# Patient Record
Sex: Male | Born: 1970 | Race: White | Hispanic: No | Marital: Married | Smoking: Never smoker
Health system: Southern US, Community
[De-identification: ages and names within clinical notes are randomized; demographics above are authoritative.]

## PROBLEM LIST (undated history)

## (undated) DIAGNOSIS — Z87898 Personal history of other specified conditions: Secondary | ICD-10-CM

## (undated) DIAGNOSIS — I8390 Asymptomatic varicose veins of unspecified lower extremity: Secondary | ICD-10-CM

## (undated) DIAGNOSIS — K625 Hemorrhage of anus and rectum: Secondary | ICD-10-CM

## (undated) DIAGNOSIS — I4891 Unspecified atrial fibrillation: Secondary | ICD-10-CM

## (undated) HISTORY — DX: Unspecified atrial fibrillation: I48.91

## (undated) HISTORY — PX: ARTHROSCOPIC REPAIR ACL: SUR80

## (undated) HISTORY — DX: Personal history of other specified conditions: Z87.898

## (undated) HISTORY — DX: Asymptomatic varicose veins of unspecified lower extremity: I83.90

## (undated) HISTORY — DX: Hemorrhage of anus and rectum: K62.5

## (undated) HISTORY — PX: KNEE ARTHROSCOPY: SUR90

---

## 1990-11-09 HISTORY — PX: OTHER SURGICAL HISTORY: SHX169

## 2005-02-05 ENCOUNTER — Ambulatory Visit: Payer: Self-pay | Admitting: Internal Medicine

## 2006-09-15 ENCOUNTER — Ambulatory Visit: Payer: Self-pay | Admitting: Internal Medicine

## 2008-04-17 ENCOUNTER — Ambulatory Visit: Payer: Self-pay | Admitting: Internal Medicine

## 2008-04-17 DIAGNOSIS — I8390 Asymptomatic varicose veins of unspecified lower extremity: Secondary | ICD-10-CM

## 2008-04-26 ENCOUNTER — Ambulatory Visit: Payer: Self-pay | Admitting: Internal Medicine

## 2008-04-26 LAB — CONVERTED CEMR LAB
Bilirubin Urine: NEGATIVE
Glucose, Urine, Semiquant: NEGATIVE
WBC Urine, dipstick: NEGATIVE
pH: 7.5

## 2008-05-21 LAB — CONVERTED CEMR LAB
AST: 18 units/L (ref 0–37)
Alkaline Phosphatase: 59 units/L (ref 39–117)
Basophils Absolute: 0 10*3/uL (ref 0.0–0.1)
Chloride: 104 meq/L (ref 96–112)
Cholesterol: 171 mg/dL (ref 0–200)
Creatinine, Ser: 1 mg/dL (ref 0.4–1.5)
Eosinophils Absolute: 0.1 10*3/uL (ref 0.0–0.7)
GFR calc non Af Amer: 89 mL/min
HDL: 28.8 mg/dL — ABNORMAL LOW (ref >39.0)
MCHC: 33 g/dL (ref 30.0–36.0)
MCV: 84.2 fL (ref 78.0–100.0)
Neutrophils Relative %: 53.2 % (ref 43.0–77.0)
Platelets: 245 10*3/uL (ref 150–400)
Potassium: 4 meq/L (ref 3.5–5.1)
TSH: 2.01 microintl units/mL (ref 0.35–5.50)
Total Bilirubin: 0.9 mg/dL (ref 0.3–1.2)
VLDL: 21 mg/dL (ref 0–40)

## 2008-05-30 ENCOUNTER — Telehealth: Payer: Self-pay | Admitting: Internal Medicine

## 2009-01-22 ENCOUNTER — Emergency Department (HOSPITAL_COMMUNITY): Admission: EM | Admit: 2009-01-22 | Discharge: 2009-01-22 | Payer: Self-pay | Admitting: Internal Medicine

## 2009-01-24 ENCOUNTER — Encounter: Payer: Self-pay | Admitting: Internal Medicine

## 2009-05-03 ENCOUNTER — Telehealth: Payer: Self-pay | Admitting: Internal Medicine

## 2009-06-13 ENCOUNTER — Ambulatory Visit: Payer: Self-pay | Admitting: Internal Medicine

## 2009-06-13 DIAGNOSIS — I4891 Unspecified atrial fibrillation: Secondary | ICD-10-CM

## 2009-06-22 DIAGNOSIS — R55 Syncope and collapse: Secondary | ICD-10-CM | POA: Insufficient documentation

## 2009-06-22 DIAGNOSIS — Z9889 Other specified postprocedural states: Secondary | ICD-10-CM

## 2010-08-27 IMAGING — CT CT PELVIS W/ CM
3 of 5 series · 13 of 32 positions shown, 18 images · IV contrast (water- bmi proto & 100 ML OMNI 300)
Comparison: None

CLINICAL DATA: Left lower quadrant pain radiating to left testicle
for 1 week

CT ABDOMEN WITH CONTRAST,CT PELVIS WITH CONTRAST
TECHNIQUE: Multidetector CT imaging of the abdomen was performed
following the standard protocol during bolus administration of
intravenous contrast.,Technique:  Multidetector CT imaging of the
pelvis was performed following the standard protocol during
Contrast: 100 ml of [id number]-A00

[Series 2: routine abdomen · axial · 0.86mm/px · z∈[-580,-215]mm · 6 of 103 slices shown, 11 images]
[im 15/103  soft-tissue]
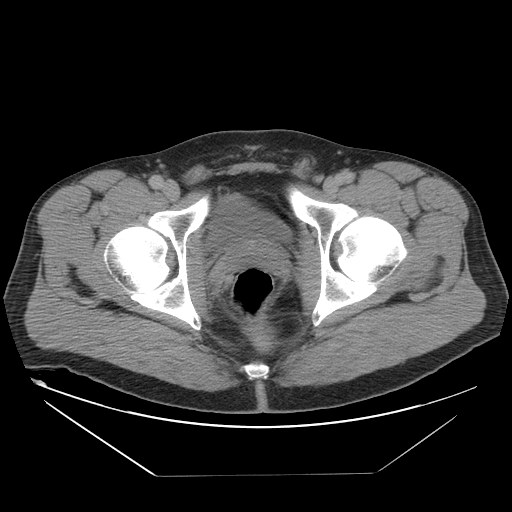
[im 15/103  bone]
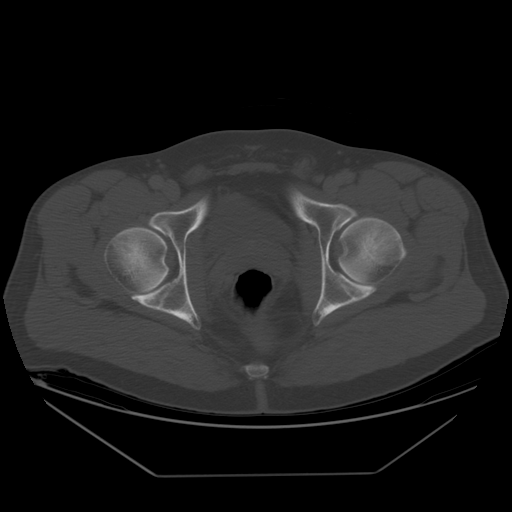
[im 30/103  soft-tissue]
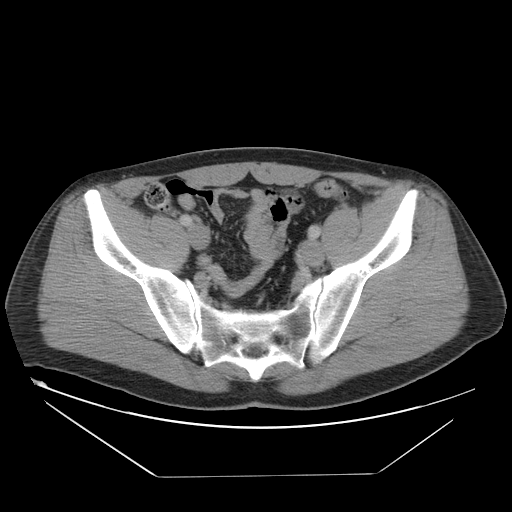
[im 44/103  soft-tissue]
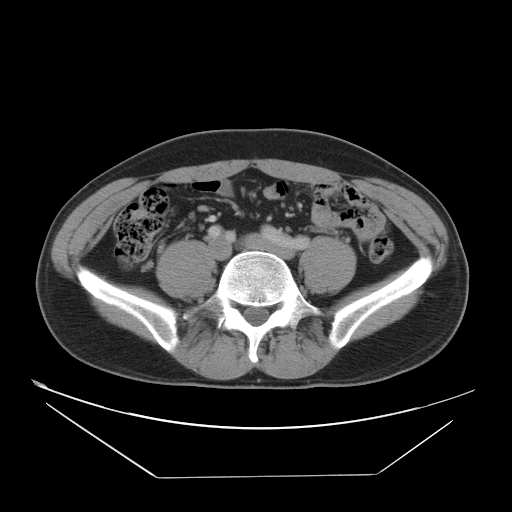
[im 44/103  lung]
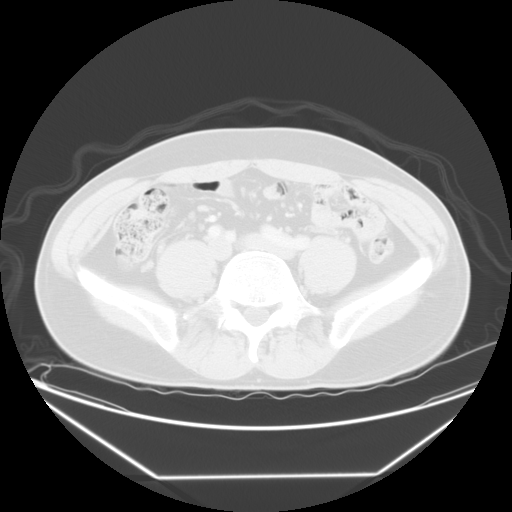
[im 59/103  soft-tissue]
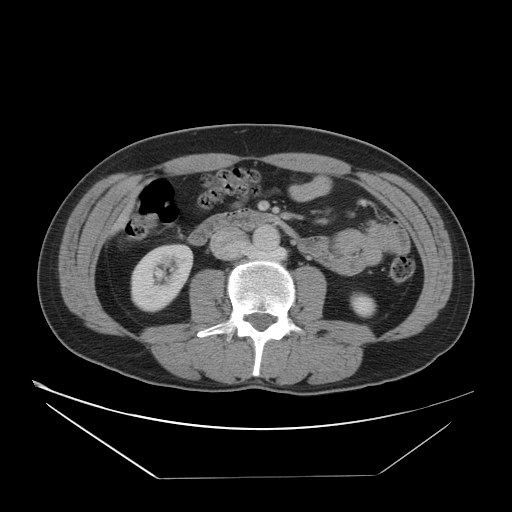
[im 59/103  lung]
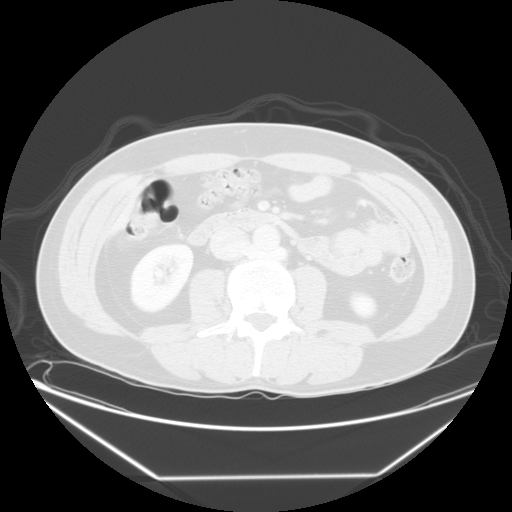
[im 73/103  soft-tissue]
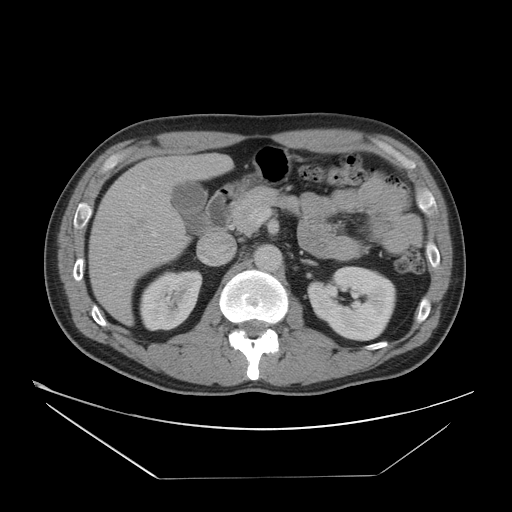
[im 73/103  lung]
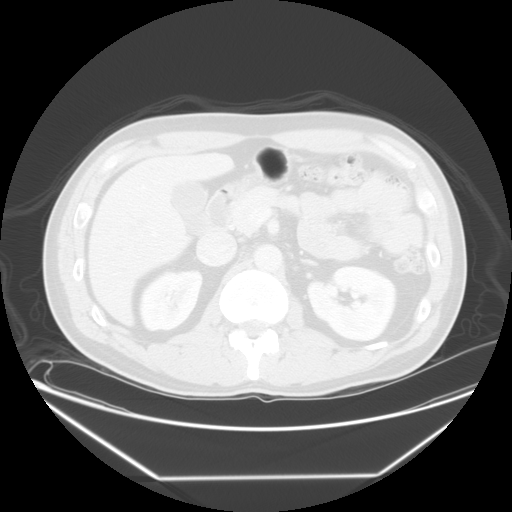
[im 88/103  soft-tissue]
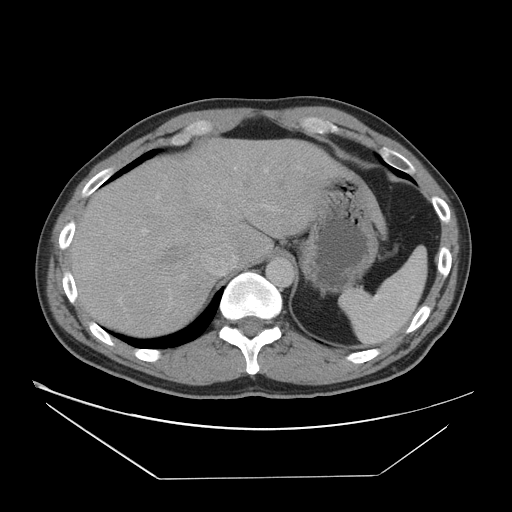
[im 88/103  lung]
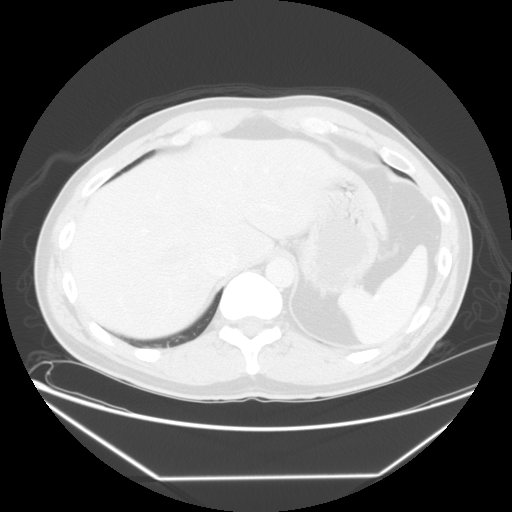

[Series 3: recon 2: routine abdomen · axial · 0.86mm/px · z∈[-595,-225]mm · 2 of 51 slices shown]
[im 17/51  soft-tissue]
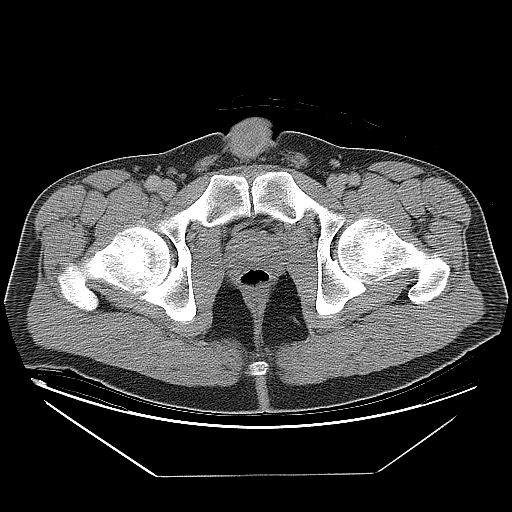
[im 34/51  soft-tissue]
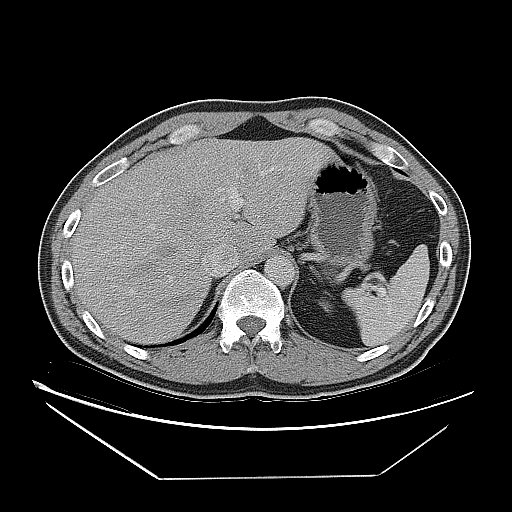

[Series 400: sag a/p · sagittal · 1.05mm/px · 5 of 124 slices shown]
[im 14/124  soft-tissue]
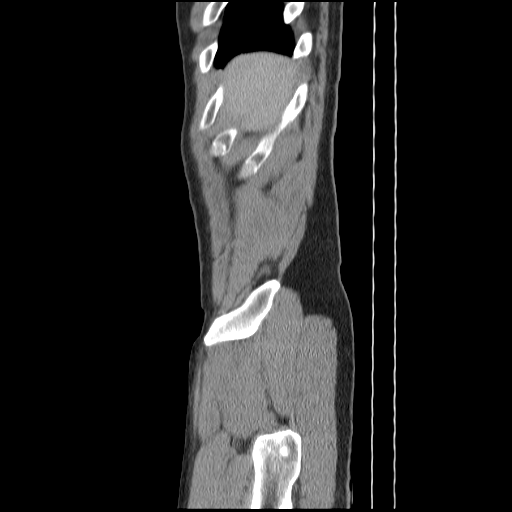
[im 28/124  soft-tissue]
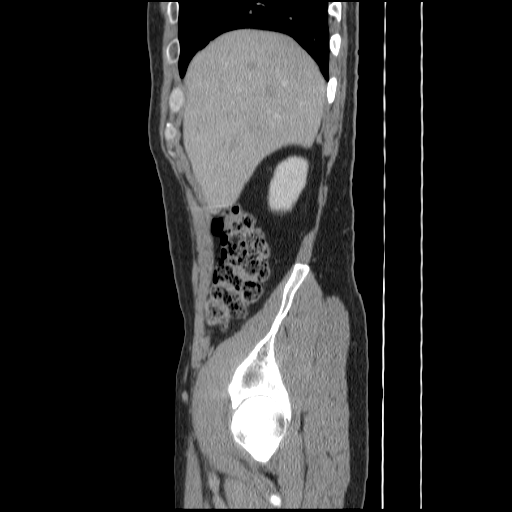
[im 42/124  soft-tissue]
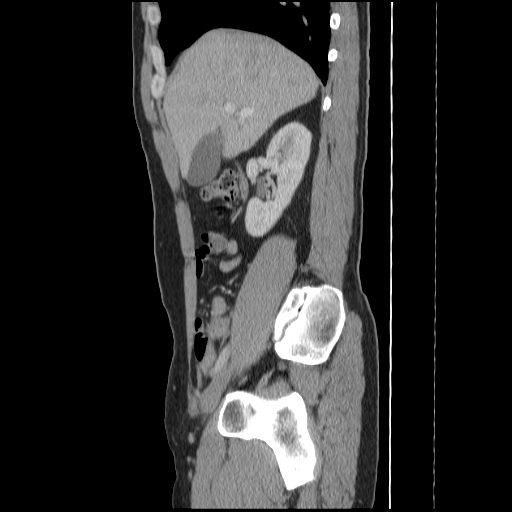
[im 55/124  soft-tissue]
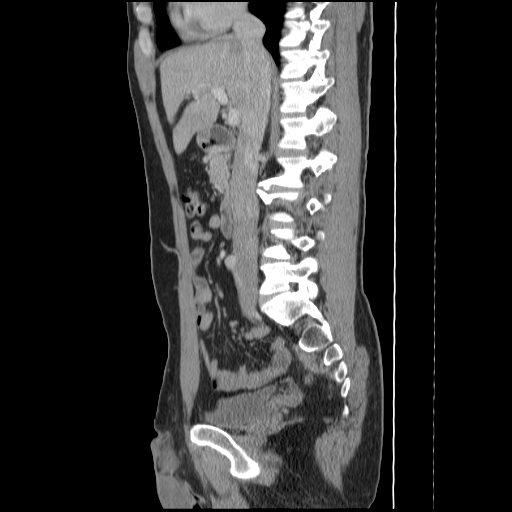
[im 69/124  soft-tissue]
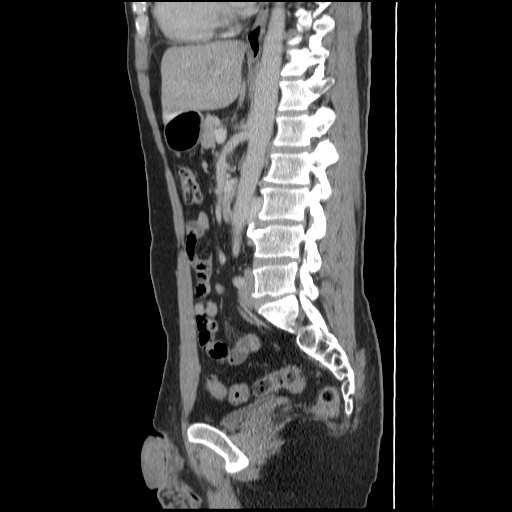

[13 of 32 positions shown; findings below may reference images not displayed]

FINDINGS: Visualized lung bases are unremarkable.  Multilevel
degenerative changes of the lumbar spine noted.  Schmorl's node
deformity noted lower endplate of T12, L1-L2 and L12-3 level.
Schmorl's node deformity noted upper endplate of L5.  Large
anterior osteophytes noted at the upper endplate of L4.  Multilevel
flattening of the space noted most significant at L2-L3 and L5-S1
level.  No acute fracture is noted.

Enhanced liver, spleen, pancreas and the adrenals are unremarkable.
A retroaortic left renal vein is noted.

Enhanced kidney are symmetrical in size.  No focal renal mass is
noted.  Mild focal cortical thinning noted mid pole posterior
cortex of the left kidney probable due to scarring.  No
hydronephrosis or hydroureter.  Delayed images of the kidneys shows
bilateral renal symmetrical excretion.  No aortic aneurysm.  No
bowel obstruction.  No ascites or free air.  No adenopathy.
IMPRESSION: 1.  Multilevel degenerative changes lumbar spine.  No acute
fracture.
2.  No acute inflammatory process within abdomen.
3.  No aortic aneurysm.  There is a retroaortic left renal vein.

Findings for the pelvis:

No pelvic fractures are noted.  A few diverticula are seen in the
sigmoid colon without evidence of acute diverticulitis.  The
urinary bladder is under distended grossly unremarkable.  No pelvic
ascites or adenopathy.  No inguinal adenopathy.  No pericecal
inflammation.  Normal appendix is clearly visualized.  The prostate
gland is unremarkable. Mild degenerative changes left SI joint.
IMPRESSION: 1.  No pelvic fractures.

2.  Mild degenerative changes left SI joint.
3.  No diverticulitis.  Normal appendix.

## 2010-12-03 ENCOUNTER — Encounter: Payer: Self-pay | Admitting: Internal Medicine

## 2010-12-17 NOTE — Consult Note (Signed)
Summary: [facility]  [facility]   Imported By: Maryln Gottron 12/10/2010 13:40:08  _____________________________________________________________________  External Attachment:    Type:   Image     Comment:   External Document

## 2011-02-19 LAB — URINALYSIS, ROUTINE W REFLEX MICROSCOPIC
Bilirubin Urine: NEGATIVE
Glucose, UA: NEGATIVE mg/dL
Hgb urine dipstick: NEGATIVE
Ketones, ur: NEGATIVE mg/dL
Nitrite: NEGATIVE
pH: 6 (ref 5.0–8.0)

## 2011-02-19 LAB — DIFFERENTIAL
Basophils Absolute: 0.1 10*3/uL (ref 0.0–0.1)
Eosinophils Relative: 1 % (ref 0–5)
Lymphocytes Relative: 15 % (ref 12–46)
Monocytes Absolute: 0.6 10*3/uL (ref 0.1–1.0)
Monocytes Relative: 6 % (ref 3–12)

## 2011-02-19 LAB — CBC
HCT: 45.1 % (ref 39.0–52.0)
Hemoglobin: 15.4 g/dL (ref 13.0–17.0)
RDW: 14.2 % (ref 11.5–15.5)

## 2011-03-27 NOTE — Assessment & Plan Note (Signed)
[facility]                           ELECTROPHYSIOLOGY OFFICE NOTE   NAME:LEWISBrook, Mall                     MRN:          [id number]  DATE:09/15/2006                            DOB:          [birth year]    Mr. Verstraete comes in today because of a history of paroxysmal atrial  fibrillation.  He was denied insurance from his insurance company because of  some part of our health records.   Reviewing the records demonstrates that he has paroxysmal atrial  fibrillation.  He also had an ejection fraction that appeared to have been  underrepresented at 46%; an echocardiogram done two weeks thereafter  demonstrated normal left ventricular function.   It also describes some nonexertional chest discomfort that resembles  somewhat of his reflux.   Electrocardiogram today demonstrates sinus rhythm at 51, with intervals of  0.15/0.10/0.40.   IMPRESSION:  1. Paroxysmal atrial fibrillation.  2. __________ chest pain.  3. Discordant number of left ventricular ejection fraction assessment.   Mr. Kempner will get [place] some information from his insurance company as to what  the issues are and see if we cannot help him with his application.    ______________________________  Duke Salvia, MD, [facility]    SCK/MedQ  DD: 09/15/2006  DT: 09/16/2006  Job #: [id number]

## 2011-05-07 ENCOUNTER — Ambulatory Visit: Payer: Self-pay | Admitting: Internal Medicine

## 2011-06-11 ENCOUNTER — Telehealth: Payer: Self-pay | Admitting: Internal Medicine

## 2011-06-11 MED ORDER — FLECAINIDE ACETATE 100 MG PO TABS: 100.0000 mg | ORAL_TABLET | ORAL | Status: DC | PRN

## 2011-06-11 NOTE — Telephone Encounter (Signed)
Per pt call, pt needs flecainide called into [facility] on [address]

## 2011-07-01 ENCOUNTER — Other Ambulatory Visit (INDEPENDENT_AMBULATORY_CARE_PROVIDER_SITE_OTHER): Payer: BC Managed Care – PPO

## 2011-07-01 DIAGNOSIS — Z0000 Encounter for general adult medical examination without abnormal findings: Secondary | ICD-10-CM

## 2011-07-01 LAB — HEPATIC FUNCTION PANEL
Bilirubin, Direct: 0.1 mg/dL (ref 0.0–0.3)
Total Bilirubin: 0.9 mg/dL (ref 0.3–1.2)
Total Protein: 7.4 g/dL (ref 6.0–8.3)

## 2011-07-01 LAB — CBC WITH DIFFERENTIAL/PLATELET
Eosinophils Absolute: 0.1 10*3/uL (ref 0.0–0.7)
HCT: 46.8 % (ref 39.0–52.0)
Lymphs Abs: 2.2 10*3/uL (ref 0.7–4.0)
MCHC: 32.9 g/dL (ref 30.0–36.0)
MCV: 83.7 fl (ref 78.0–100.0)
Monocytes Absolute: 0.8 10*3/uL (ref 0.1–1.0)
Neutrophils Relative %: 53.8 % (ref 43.0–77.0)
Platelets: 219 10*3/uL (ref 150.0–400.0)
RDW: 14.8 % — ABNORMAL HIGH (ref 11.5–14.6)

## 2011-07-01 LAB — LIPID PANEL
Cholesterol: 201 mg/dL — ABNORMAL HIGH (ref 0–200)
HDL: 39 mg/dL — ABNORMAL LOW (ref >39.00)
Total CHOL/HDL Ratio: 5
Triglycerides: 118 mg/dL (ref 0.0–149.0)

## 2011-07-01 LAB — BASIC METABOLIC PANEL
BUN: 15 mg/dL (ref 6–23)
CO2: 30 mEq/L (ref 19–32)
Chloride: 104 mEq/L (ref 96–112)
Creatinine, Ser: 1 mg/dL (ref 0.4–1.5)
Glucose, Bld: 98 mg/dL (ref 70–99)

## 2011-07-01 LAB — POCT URINALYSIS DIPSTICK
Leukocytes, UA: NEGATIVE
Protein, UA: NEGATIVE
pH, UA: 5.5

## 2011-07-08 ENCOUNTER — Ambulatory Visit (INDEPENDENT_AMBULATORY_CARE_PROVIDER_SITE_OTHER): Payer: BC Managed Care – PPO | Admitting: Internal Medicine

## 2011-07-08 ENCOUNTER — Encounter: Payer: Self-pay | Admitting: Internal Medicine

## 2011-07-08 VITALS — BP 120/80 | HR 72 | Ht 79.0 in | Wt 260.0 lb

## 2011-07-08 DIAGNOSIS — Z0000 Encounter for general adult medical examination without abnormal findings: Secondary | ICD-10-CM

## 2011-07-08 DIAGNOSIS — M7918 Myalgia, other site: Secondary | ICD-10-CM

## 2011-07-08 DIAGNOSIS — IMO0001 Reserved for inherently not codable concepts without codable children: Secondary | ICD-10-CM

## 2011-07-08 DIAGNOSIS — I8390 Asymptomatic varicose veins of unspecified lower extremity: Secondary | ICD-10-CM

## 2011-07-08 DIAGNOSIS — I4891 Unspecified atrial fibrillation: Secondary | ICD-10-CM

## 2011-07-08 MED ORDER — FLECAINIDE ACETATE 100 MG PO TABS: 100.0000 mg | ORAL_TABLET | ORAL | Status: DC | PRN

## 2011-07-08 NOTE — Progress Notes (Signed)
Subjective:    Patient ID: Gregory Peck, male    DOB: 1971-02-14, 40 y.o.   MRN: [id number]  HPI Patient comes in today for Preventive Health Care visit . His last visit was 04/2008 in our primary care practice. Since his last visit he has had no major changes in his health status. He is on medication for paroxysmal atrial fibrillation that he takes as needed. His last visit with Dr. Graciela Husbands was almost 2 years ago.  asks about  Cardiac med. He takes medicine as needed one pill if he thinks it's going to flare up in 3 pills if he is in active atrial fib and it usually calms it down after 2-3 hours. He has about 3 pills left over like to have some more in case. He denies any recent episode chest pain shortness of breath or exercise intolerance. Called for refill    to cardiology and the nurse was supposed to get back with him about it he needed an appointment ; never heard back from cardiology.  Hearing and vision test this year is good.   Old injury foot left toe laceration was problematic and now has left hip  back pocket area  Dull ache and not focal.?  From gait issue  Thinks gait changed after old injury.     Has seen chiro and PT .   ocass left foot  Issue. Not getting better wonders if we have any suggestion [place] uncle  died of brain aneurism.     / what risk is he having with his varicose veins. He has no history of unusual headache syncope related to this vision changes..     Review of Systems ROS:  GEN/ HEENTNo fever, significant weight changes sweats headaches vision problems hearing changes, CV/ PULM; No chest pain shortness of breath cough, syncope,edema  change in exercise tolerance.  Except  Attacks of arrythmia  Taking  Tambocor   As needed  Has 3 pills left. GI /GU: No adominal pain, vomiting, change in bowel habits. No blood in the stool. No significant GU symptoms. SKIN/HEME: ,no acute skin rashes suspicious lesions or bleeding. No lymphadenopathy, nodules, masses.  NEURO/ PSYCH:   No neurologic signs such as weakness numbness No depression anxiety. IMM/ Allergy: No unusual infections.  Allergy .   REST of 12 system review negative. Or see history of present illness  Past history family history social history reviewed in the electronic medical record.      Objective:   Physical Exam Physical Exam: Vital signs reviewed WUJ:WJXB is a well-developed well-nourished alert cooperative  White male  who appears   stated age in no acute distress.  HEENT: normocephalic  traumatic , Eyes: PERRL EOM's full, conjunctiva clear, Nares: patent no deformity discharge or tenderness., Ears: no deformity EAC's clear TMs with normal landmarks. Mouth: clear OP, no lesions, edema.  Moist mucous membranes. Dentition in adequate repair. NECK: supple without masses, thyromegaly or bruits. CHEST/PULM:  Clear to auscultation and percussion breath sounds equal no wheeze , rales or rhonchi. No chest wall deformities or tenderness. CV: PMI is nondisplaced, S1 S2 no gallops, murmurs, rubs. Peripheral pulses are full without delay.No JVD .  ABDOMEN: Bowel sounds normal nontender  No guard or rebound, no hepato splenomegal no CVA tenderness.  No hernia. Has a history of hard bowel movements occasionally gets a hemorrhoid flare. Saw Dr. Clent Ridges in the past and had a colonoscopy at age 60 Extremtities:  No clubbing cyanosis or edema, no acute joint  swelling or redness no focal atrophy he has prominent varicose veins on his left lower extremity more than his right; there no ulcerations. NEURO:  Oriented x3, cranial nerves 3-12 appear to be intact, no obvious focal weakness,gait within normal limits no abnormal reflexes or asymmetrical SKIN: No acute rashes normal turgor, color, no bruising or petechiae. PSYCH: Oriented, good eye contact, no obvious depression anxiety, cognition and judgment appear normal. LN:  No cervical axillary or inguinal adenopathy Laboratory studies reviewed    Assessment & Plan:   Preventive Health Care  It has been over 3 years since last visit and updated as a new patient Counseled regarding healthy nutrition, exercise, sleep, injury prevention, calcium vit d and healthy weight . Discussed cutting out sweets tea excess sugars Up to date  on healthcare parameters per hx .  Left buttock hip back pain. Possibly secondary to gait change from old injury he has apparently been through chiropractic multiple physical therapies not really interested in seeing an orthopedic at this time. Discussed seeing sports medicine clinic gait training etc. will call us if he is interested. In the meantime strength and hamstrings and hip girdle and obliques.  Paroxyxmal Atrial fib  hx Using prn  meds  And due for cards check that fell through.  We'll go ahead and refill 15 pills and try to facilitate his office appointment with Dr. Graciela Husbands  varicose veins   no change or progression  Other ?s answered

## 2011-07-08 NOTE — Patient Instructions (Addendum)
Call if want a referral to sports medicine   Or other for the back buttock pain.  The varicose veins have nothing to do  With aneurysm.   Continue lifestyle intervention healthy eating and exercise . Limit sweets.    Will refill  Med x 1 but this should be refilled by Dr Graciela Husbands   Advise appt with Dr Graciela Husbands. Will help facilitate appt.

## 2011-07-15 ENCOUNTER — Telehealth: Payer: Self-pay | Admitting: Internal Medicine

## 2011-07-15 NOTE — Telephone Encounter (Signed)
Message copied by Madelin Headings on Wed Jul 15, 2011 11:19 PM ------      Message from: Romualdo Bolk      Created: Fri Jul 10, 2011  3:20 PM       I called Cardiology and they are going to call him back to set up the appt. Pt aware of this.      ----- Message -----         From: Lorretta Harp         Sent: 07/08/2011   6:16 PM           To: Romualdo Bolk            Please have cardiology contact him about appt.

## 2012-01-21 ENCOUNTER — Telehealth: Payer: Self-pay | Admitting: Internal Medicine

## 2012-01-21 NOTE — Telephone Encounter (Signed)
I spoke with the patient. 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 The patient last saw Dr. Graciela Husbands in 06/2009 for PAF. He was supposed to follow up in 12 months, but has not been seen since. I explained to the patient I would have to review this with Dr. Graciela Husbands prior to doing a note for him. I am not certain if Dr. Graciela Husbands would want to see him back before stating this. The patient states he has been doing well. The patient currently has a PRN flecainide prescription that we last filled in 8/12 with instructions stating the patient needs an office visit. I will call the patient back next week after Dr. Graciela Husbands reviews.

## 2012-01-21 NOTE — Telephone Encounter (Signed)
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 Please call

## 2012-01-25 ENCOUNTER — Encounter: Payer: Self-pay | Admitting: Internal Medicine

## 2012-01-25 ENCOUNTER — Ambulatory Visit (INDEPENDENT_AMBULATORY_CARE_PROVIDER_SITE_OTHER): Payer: BC Managed Care – PPO | Admitting: Internal Medicine

## 2012-01-25 VITALS — BP 120/80 | HR 86 | Temp 97.5°F | Wt 265.0 lb

## 2012-01-25 DIAGNOSIS — J3089 Other allergic rhinitis: Secondary | ICD-10-CM | POA: Insufficient documentation

## 2012-01-25 DIAGNOSIS — J029 Acute pharyngitis, unspecified: Secondary | ICD-10-CM

## 2012-01-25 DIAGNOSIS — J020 Streptococcal pharyngitis: Secondary | ICD-10-CM

## 2012-01-25 DIAGNOSIS — J302 Other seasonal allergic rhinitis: Secondary | ICD-10-CM | POA: Insufficient documentation

## 2012-01-25 DIAGNOSIS — R509 Fever, unspecified: Secondary | ICD-10-CM

## 2012-01-25 DIAGNOSIS — J309 Allergic rhinitis, unspecified: Secondary | ICD-10-CM

## 2012-01-25 DIAGNOSIS — J0300 Acute streptococcal tonsillitis, unspecified: Secondary | ICD-10-CM | POA: Insufficient documentation

## 2012-01-25 LAB — POCT RAPID STREP A (OFFICE): Rapid Strep A Screen: POSITIVE — AB

## 2012-01-25 MED ORDER — AMOXICILLIN 500 MG PO CAPS: 500.0000 mg | ORAL_CAPSULE | Freq: Two times a day (BID) | ORAL | Status: AC

## 2012-01-25 MED ORDER — FLUTICASONE PROPIONATE 50 MCG/ACT NA SUSP: 2.0000 | Freq: Every day | NASAL | Status: AC

## 2012-01-25 NOTE — Patient Instructions (Signed)

## 2012-01-25 NOTE — Progress Notes (Signed)
  Subjective:    Patient ID: Gregory Peck, male    DOB: 12-17-1970, 41 y.o.   MRN: [id number]  HPI Patient comes in today for SDA for  new problem evaluation. Fever off and on and sore throat . For 2+ days  Hurting badly .   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  Using otc meds not that helpful .  Remote hx of strep throat whe younger. Review of Systems No chest pain shortness of breath does have fever. Get seasonal allergies and although they are not acting up now asks for Flonase that he has used in the past. This has helped keep his allergy symptoms under control  Past history family history social history reviewed in the electronic medical record.     Objective:   Physical Exam Well-developed well-nourished in no acute distress mildly ill. HEENT: Normocephalic ;atraumatic , Eyes;  PERRL, EOMs  Full, lids and conjunctiva clear,,Ears: no deformities, canals nl, TM landmarks normal, Nose: no deformity or discharge  Mouth : OPTONSILS 2+ with beefy redness and 2+ exudate airway is good Neck: Supple with tender ac nodes  And no masses or bruits Abdomen:  Sof,t normal bowel sounds without hepatosplenomegaly, no guarding rebound or masses no CVA tenderness Chest:  Clear to A&P without wheezes rales or rhonchi CV:  S1-S2 no gallops or murmurs peripheral perfusion is normal. Skin: normal capillary refill ,turgor , color: No acute rashes ,petechiae or bruising  RS positive     Assessment & Plan:  Acute streptococcal tonsillitis  Symptomatic treatment plus amoxicillin twice a day for 10 days expectant management. Counseling about communicability and recurrence.  History of seasonal rhinitis and allergies  : rx  Flonase for control during the season.

## 2012-01-27 NOTE — Telephone Encounter (Signed)
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

## 2012-01-27 NOTE — Telephone Encounter (Signed)
I spoke with the patient and he is aware he will need to have an office visit with Dr. Graciela Husbands. I have scheduled him for Monday 02/01/12 at 9:00 am.

## 2012-02-01 ENCOUNTER — Encounter: Payer: Self-pay | Admitting: Internal Medicine

## 2012-02-01 ENCOUNTER — Ambulatory Visit (INDEPENDENT_AMBULATORY_CARE_PROVIDER_SITE_OTHER): Payer: BC Managed Care – PPO | Admitting: Internal Medicine

## 2012-02-01 ENCOUNTER — Ambulatory Visit (HOSPITAL_COMMUNITY): Payer: BC Managed Care – PPO

## 2012-02-01 VITALS — BP 108/78 | HR 60 | Ht 79.0 in | Wt 263.4 lb

## 2012-02-01 DIAGNOSIS — I4891 Unspecified atrial fibrillation: Secondary | ICD-10-CM

## 2012-02-01 NOTE — Progress Notes (Signed)
  HPI  Gregory Peck is a 41 y.o. male seen in followup for paroxysmal atrial fibrillation. This occurs relatively infrequently. Some of the spells are "more intense" than others this being characterized by shortness of breath and lightheadedness. He takes flecainide p.r.n. with the spells and when it lasts more than a little bit he takes 300 mg at a time. All of the recent spells have had terminated with in 3 hours.  He has had no daytime episodes  The patient denies chest pain, shortness of breath, nocturnal dyspnea, orthopnea or peripheral edema.       Past Medical History  Diagnosis Date  . History of syncope   . Asymptomatic varicose veins   . Atrial fibrillation   . Rectal bleed     Colonoscopy age 53 hemorrhoids Buccini    Past Surgical History  Procedure Date  . Knee arthroscopy   . Varicolectomy 1992    Current Outpatient Prescriptions  Medication Sig Dispense Refill  . amoxicillin (AMOXIL) 500 MG capsule Take 1 capsule (500 mg total) by mouth 2 (two) times daily.  20 capsule  0  . flecainide (TAMBOCOR) 100 MG tablet Take 1 tablet (100 mg total) by mouth as needed.  15 tablet  0  . fluticasone (FLONASE) 50 MCG/ACT nasal spray Place 2 sprays into the nose daily.  16 g  3    No Known Allergies  Review of Systems negative except from HPI and PMH  Physical Exam BP 108/78  Pulse 60  Ht 6\' 7"  (2.007 m)  Wt 263 lb 6.4 oz (119.477 kg)  BMI 29.67 kg/m2 Well developed and well nourished in no acute distress HENT normal E scleral and icterus clear Neck Supple JVP flat; carotids brisk and full Clear to ausculation egular rate and rhythm, no murmurs gallops or rub Soft with active bowel sounds No clubbing cyanosis none Edema Alert and oriented, grossly normal motor and sensory function Skin Warm and Dry   Assessment and  Plan

## 2012-02-01 NOTE — Patient Instructions (Signed)
Your physician has requested that you have an echocardiogram. Echocardiography is a painless test that uses sound waves to create images of your heart. It provides your doctor with information about the size and shape of your heart and how well your heart's chambers and valves are working. This procedure takes approximately one hour. There are no restrictions for this procedure.  Your physician wants you to follow-up in: 1 year with Dr. Klein. You will receive a reminder letter in the mail two months in advance. If you don't receive a letter, please call our office to schedule the follow-up appointment.  

## 2012-02-01 NOTE — Assessment & Plan Note (Signed)
He continues to have intermittent episodes of nocturnal atrial fibrillation almost certainly vagal in origin. He has had no daytime episodes. We will review his heart to make sure it's still structurally normal.  His CHADS-VASc score is 0 considered inappropriate for him to be on anticoagulation. 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

## 2012-02-12 ENCOUNTER — Other Ambulatory Visit: Payer: Self-pay | Admitting: Cardiology

## 2012-02-12 DIAGNOSIS — I4891 Unspecified atrial fibrillation: Secondary | ICD-10-CM

## 2012-02-16 ENCOUNTER — Other Ambulatory Visit: Payer: BC Managed Care – PPO

## 2013-10-30 ENCOUNTER — Telehealth: Payer: Self-pay | Admitting: Internal Medicine

## 2013-10-30 NOTE — Telephone Encounter (Signed)
New Problem:  Pt's wife is calling in requesting a call back from the nurse to discuss her husband's care.

## 2013-10-30 NOTE — Telephone Encounter (Signed)
Pt has cpx sch for 01-2014. Pt would like written rx for cpx labs to take to outside lab

## 2013-10-30 NOTE — Telephone Encounter (Signed)
Pt's wife explains pt is out of prn Flecanide and they are requesting prn refill. I advised them that because they haven't seen Dr. Graciela Husbands in over a year and a half that I would have to wait and ask him when he returns on 01/02. They are scheduled to see Graciela Husbands 2/3. She states that he hasn't needed med in the last year and I advised that if he has any issues before office visit to go to hospital. She is agreeable to plan.

## 2013-10-31 NOTE — Telephone Encounter (Signed)
Left message on personally identified cell informing the pt to pick up at the front desk.

## 2013-10-31 NOTE — Telephone Encounter (Signed)
Ok to do this  v70.0

## 2013-11-10 ENCOUNTER — Other Ambulatory Visit: Payer: Self-pay | Admitting: *Deleted

## 2013-11-10 MED ORDER — FLECAINIDE ACETATE 100 MG PO TABS: 100.0000 mg | ORAL_TABLET | Freq: Every day | ORAL | Status: DC | PRN

## 2013-11-10 NOTE — Telephone Encounter (Signed)
Prescription sent in for prn Flecanide, per Dr. Graciela HusbandsKlein. Pt has f/u appt in February. Pt's wife agreeable to plan.

## 2013-12-12 ENCOUNTER — Encounter (INDEPENDENT_AMBULATORY_CARE_PROVIDER_SITE_OTHER): Payer: Self-pay

## 2013-12-12 ENCOUNTER — Ambulatory Visit (INDEPENDENT_AMBULATORY_CARE_PROVIDER_SITE_OTHER): Payer: PRIVATE HEALTH INSURANCE | Admitting: Internal Medicine

## 2013-12-12 ENCOUNTER — Encounter: Payer: Self-pay | Admitting: Internal Medicine

## 2013-12-12 VITALS — BP 122/88 | HR 60 | Ht 79.0 in | Wt 269.2 lb

## 2013-12-12 DIAGNOSIS — I4891 Unspecified atrial fibrillation: Secondary | ICD-10-CM

## 2013-12-12 DIAGNOSIS — I480 Paroxysmal atrial fibrillation: Secondary | ICD-10-CM

## 2013-12-12 MED ORDER — DILTIAZEM HCL 60 MG PO TABS: 60.0000 mg | ORAL_TABLET | Freq: Four times a day (QID) | ORAL | Status: DC | PRN

## 2013-12-12 MED ORDER — FLECAINIDE ACETATE 100 MG PO TABS: 100.0000 mg | ORAL_TABLET | Freq: Every day | ORAL | Status: DC | PRN

## 2013-12-12 MED ORDER — VERAPAMIL HCL 80 MG PO TABS: 80.0000 mg | ORAL_TABLET | Freq: Four times a day (QID) | ORAL | Status: DC | PRN

## 2013-12-12 NOTE — Progress Notes (Signed)
      Patient Care Team: Madelin HeadingsWanda K Panosh, MD as PCP - General Duke SalviaSteven C Klein, MD (Cardiology)   HPI  [facility] MachoMarc Preusser is a 43 y.o. male seen in followup for paroxysmal atrial fibrillation. ness. He takes flecainide p.r.n. with the spells and when it lasts more than a little bit he takes 300 mg at a time. All of the recent spells have had terminated with in 3 hours.   He's had a couple of episodes the most recent of which was quite disconcerting to both him and to his wife. There is a sensation that the heart was beating very very fast. He felt like in taking "too much caffeine" and then the episode terminated.      Past Medical History  Diagnosis Date  . History of syncope   . Asymptomatic varicose veins   . Atrial fibrillation   . Rectal bleed     Colonoscopy age 43 hemorrhoids Buccini    Past Surgical History  Procedure Laterality Date  . Knee arthroscopy    . Varicolectomy  1992    Current Outpatient Prescriptions  Medication Sig Dispense Refill  . flecainide (TAMBOCOR) 100 MG tablet Take 1 tablet (100 mg total) by mouth daily as needed.  15 tablet  0   No current facility-administered medications for this visit.    No Known Allergies  Review of Systems negative except from HPI and PMH  Physical Exam BP 122/88  Pulse 60  Ht 6\' 7"  (2.007 m)  Wt 269 lb 3.2 oz (122.108 kg)  BMI 30.31 kg/m2 Well developed and nourished in no acute distress HENT normal Neck supple with JVP-flat Clear Regular rate and rhythm, no murmurs or gallops Abd-soft with active BS No Clubbing cyanosis edema Skin-warm and dry A & Oriented  Grossly normal sensory and motor function  NSR  [phone]   Assessment and  Plan

## 2013-12-12 NOTE — Assessment & Plan Note (Signed)
He has had frequent paroxysms. The description of his most recent episode is worrisome for the possibility of regularization of his atrial fibrillation giving rise to  atrial flutter with a paradoxical increase in heart rate. I will give himr a prescription for verapamil to take concomitantly with his flecainide. We have also discussed catheter ablation. He is not interested in pursuing that at this time  We'll see him again in one years time. We also discussed his low CHADS-VASc score and the lack of a recommendation for anticoagulation. His wife was with him today she remains quite concerned.

## 2013-12-12 NOTE — Addendum Note (Signed)
Addended by: Baird LyonsPRICE, Jibri Schriefer L on: 12/12/2013 03:54 PM   Modules accepted: Orders, Medications

## 2013-12-12 NOTE — Patient Instructions (Addendum)
Your physician has recommended you make the following change in your medication:  1) Start taking Diltiazem 60 mg daily every 6 hours as needed for atrial fibrillation.  Your physician wants you to follow-up in: 1 year with Dr. Graciela HusbandsKlein.  You will receive a reminder letter in the mail two months in advance. If you don't receive a letter, please call our office to schedule the follow-up appointment.

## 2014-01-23 ENCOUNTER — Encounter: Payer: PRIVATE HEALTH INSURANCE | Admitting: Internal Medicine

## 2014-01-23 ENCOUNTER — Encounter: Payer: Self-pay | Admitting: Internal Medicine

## 2014-01-23 NOTE — Progress Notes (Signed)
Document opened and reviewed for CPX but appt  canceled same day .   

## 2014-03-14 ENCOUNTER — Encounter: Payer: PRIVATE HEALTH INSURANCE | Admitting: Internal Medicine

## 2014-11-23 ENCOUNTER — Telehealth: Payer: Self-pay | Admitting: Internal Medicine

## 2014-11-23 NOTE — Telephone Encounter (Signed)
Spoke with patient who states ok to speak to wife. They are confused about correct instructions for taking the Flecainide and Diltiazem when he is having an episode. He had one last night - took Diltiazem, didn't work so took Flecainide little while later, didn't work so took 2 more Flecainide. Explained/clarified he should take Flecainide and Diltiazem together. If after a little while still no improvement he may then take 2 more Flecainide pills. Patient and his wife voiced understanding and agreeable to taking it this way. He will call back if this doesn't work.

## 2014-11-23 NOTE — Telephone Encounter (Signed)
New message       Wife needs to know what medications to give and the dosage when patient's heart goes out of rhythum

## 2014-12-05 ENCOUNTER — Telehealth: Payer: Self-pay | Admitting: Internal Medicine

## 2014-12-05 NOTE — Telephone Encounter (Signed)
Pt needs cpx lab order to pick up and take to outside lab. Pt wife would like to pick up this wk. Pt is sch for cpx on 02-13-15

## 2014-12-06 NOTE — Telephone Encounter (Signed)
Called and left a message on identified cell informing the pt to pick up an order at the front desk.  No DPR on file for the patient's wife.

## 2014-12-24 ENCOUNTER — Telehealth: Payer: Self-pay | Admitting: Internal Medicine

## 2014-12-24 NOTE — Telephone Encounter (Signed)
Calling stating he has been out of rhythm for about 3 hrs.  He took the diltiazem and flecainide about 1:30 and he is still out of rhythm.  States he can't remember what Sherri told him about a month ago regarding how to take meds if continued to be out of rhythm.  Per Sherri's note should take diltiazem and flecainide together and if after a while still out of rhythm can take 2 more flecainide.  States he understands and will take the other 2 pills of flecainide.  Advised if continued to be out of rhythm will need to go to ER.  He verbalizes understanding.

## 2014-12-24 NOTE — Telephone Encounter (Signed)
New message      Pt is not sure how he is to be taking his diltiazem and flecainide.

## 2015-02-13 ENCOUNTER — Ambulatory Visit (INDEPENDENT_AMBULATORY_CARE_PROVIDER_SITE_OTHER): Payer: PRIVATE HEALTH INSURANCE | Admitting: Internal Medicine

## 2015-02-13 ENCOUNTER — Encounter: Payer: Self-pay | Admitting: Internal Medicine

## 2015-02-13 VITALS — BP 120/80 | Temp 97.6°F | Ht 77.5 in | Wt 272.9 lb

## 2015-02-13 DIAGNOSIS — R7309 Other abnormal glucose: Secondary | ICD-10-CM

## 2015-02-13 DIAGNOSIS — E785 Hyperlipidemia, unspecified: Secondary | ICD-10-CM | POA: Diagnosis not present

## 2015-02-13 DIAGNOSIS — I8393 Asymptomatic varicose veins of bilateral lower extremities: Secondary | ICD-10-CM

## 2015-02-13 DIAGNOSIS — Z0000 Encounter for general adult medical examination without abnormal findings: Secondary | ICD-10-CM | POA: Diagnosis not present

## 2015-02-13 NOTE — Patient Instructions (Addendum)
Cholesterol and triglycerides are high .  Hg a1c is in  At risk for diabetes range  healhty weight loss to bmi below 130 at least  You will be contacted about  Vascular vein referral as discussed   Intensify lifestyle interventions. Healthy lifestyle includes : At least 150 minutes of exercise weeks  , weight at healthy levels, which is usually   BMI 19-25. Avoid trans fats and processed foods;  Increase fresh fruits and veges to 5 servings per day. And avoid sweet beverages including tea and juice. Mediterranean diet with olive oil and nuts have been noted to be heart and brain healthy . Avoid tobacco products . Limit  alcohol to  7 per week for women and 14 servings for men.  Get adequate sleep . Wear seat belts . Don't text and drive .      Why follow it? Research shows. . Those who follow the Mediterranean diet have a reduced risk of heart disease  . The diet is associated with a reduced incidence of Parkinson's and Alzheimer's diseases . People following the diet may have longer life expectancies and lower rates of chronic diseases  . The Dietary Guidelines for Americans recommends the Mediterranean diet as an eating plan to promote health and prevent disease  What Is the Mediterranean Diet?  . Healthy eating plan based on typical foods and recipes of Mediterranean-style cooking . The diet is primarily a plant based diet; these foods should make up a majority of meals   Starches - Plant based foods should make up a majority of meals - They are an important sources of vitamins, minerals, energy, antioxidants, and fiber - Choose whole grains, foods high in fiber and minimally processed items  - Typical grain sources include wheat, oats, barley, corn, brown rice, bulgar, farro, millet, polenta, couscous  - Various types of beans include chickpeas, lentils, fava beans, black beans, white beans   Fruits  Veggies - Large quantities of antioxidant rich fruits & veggies; 6 or more  servings  - Vegetables can be eaten raw or lightly drizzled with oil and cooked  - Vegetables common to the traditional Mediterranean Diet include: artichokes, arugula, beets, broccoli, brussel sprouts, cabbage, carrots, celery, collard greens, cucumbers, eggplant, kale, leeks, lemons, lettuce, mushrooms, okra, onions, peas, peppers, potatoes, pumpkin, radishes, rutabaga, shallots, spinach, sweet potatoes, turnips, zucchini - Fruits common to the Mediterranean Diet include: apples, apricots, avocados, cherries, clementines, dates, figs, grapefruits, grapes, melons, nectarines, oranges, peaches, pears, pomegranates, strawberries, tangerines  Fats - Replace butter and margarine with healthy oils, such as olive oil, canola oil, and tahini  - Limit nuts to no more than a handful a day  - Nuts include walnuts, almonds, pecans, pistachios, pine nuts  - Limit or avoid candied, honey roasted or heavily salted nuts - Olives are central to the [employer] - can be eaten whole or used in a variety of dishes   Meats Protein - Limiting red meat: no more than a few times a month - When eating red meat: choose lean cuts and keep the portion to the size of deck of cards - Eggs: approx. 0 to 4 times a week  - Fish and lean poultry: at least 2 a week  - Healthy protein sources include, chicken, [place], lean beef, lamb - Increase intake of seafood such as tuna, salmon, trout, mackerel, shrimp, scallops - Avoid or limit high fat processed meats such as sausage and bacon  Dairy - Include moderate amounts of low  fat dairy products  - Focus on healthy dairy such as fat free yogurt, skim milk, low or reduced fat cheese - Limit dairy products higher in fat such as whole or 2% milk, cheese, ice cream  Alcohol - Moderate amounts of red wine is ok  - No more than 5 oz daily for women (all ages) and men older than age 29  - No more than 10 oz of wine daily for men younger than 89  Other - Limit sweets and other  desserts  - Use herbs and spices instead of salt to flavor foods  - Herbs and spices common to the traditional Mediterranean Diet include: basil, bay leaves, chives, cloves, cumin, fennel, garlic, lavender, marjoram, mint, oregano, parsley, pepper, rosemary, sage, savory, sumac, tarragon, thyme   It's not just a diet, it's a lifestyle:  . The Mediterranean diet includes lifestyle factors typical of those in the region  . Foods, drinks and meals are best eaten with others and savored . Daily physical activity is important for overall good health . This could be strenuous exercise like running and aerobics . This could also be more leisurely activities such as walking, housework, yard-work, or taking the stairs . Moderation is the key; a balanced and healthy diet accommodates most foods and drinks . Consider portion sizes and frequency of consumption of certain foods   Meal Ideas & Options:  . Breakfast:  o Whole wheat toast or whole wheat English muffins with peanut butter & hard boiled egg o Steel cut oats topped with apples & cinnamon and skim milk  o Fresh fruit: banana, strawberries, melon, berries, peaches  o Smoothies: strawberries, bananas, greek yogurt, peanut butter o Low fat greek yogurt with blueberries and granola  o Egg white omelet with spinach and mushrooms o Breakfast couscous: whole wheat couscous, apricots, skim milk, cranberries  . Sandwiches:  o Hummus and grilled vegetables (peppers, zucchini, squash) on whole wheat bread   o Grilled chicken on whole wheat pita with lettuce, tomatoes, cucumbers or tzatziki  o Tuna salad on whole wheat bread: tuna salad made with greek yogurt, olives, red peppers, capers, green onions o Garlic rosemary lamb pita: lamb sauted with garlic, rosemary, salt & pepper; add lettuce, cucumber, greek yogurt to pita - flavor with lemon juice and black pepper  . Seafood:  o Mediterranean grilled salmon, seasoned with garlic, basil, parsley, lemon  juice and black pepper o Shrimp, lemon, and spinach whole-grain pasta salad made with low fat greek yogurt  o Seared scallops with lemon orzo  o Seared tuna steaks seasoned salt, pepper, coriander topped with tomato mixture of olives, tomatoes, olive oil, minced garlic, parsley, green onions and cappers  . Meats:  o Herbed greek chicken salad with kalamata olives, cucumber, feta  o Red bell peppers stuffed with spinach, bulgur, lean ground beef (or lentils) & topped with feta   o Kebabs: skewers of chicken, tomatoes, onions, zucchini, squash  o [place] burgers: made with red onions, mint, dill, lemon juice, feta cheese topped with roasted red peppers . Vegetarian o Cucumber salad: cucumbers, artichoke hearts, celery, red onion, feta cheese, tossed in olive oil & lemon juice  o Hummus and whole grain pita points with a greek salad (lettuce, tomato, feta, olives, cucumbers, red onion) o Lentil soup with celery, carrots made with vegetable broth, garlic, salt and pepper  o Tabouli salad: parsley, bulgur, mint, scallions, cucumbers, tomato, radishes, lemon juice, olive oil, salt and pepper.  Food Choices to Lower Your Triglycerides  Triglycerides are a type of fat in your blood. High levels of triglycerides can increase the risk of heart disease and stroke. If your triglyceride levels are high, the foods you eat and your eating habits are very important. Choosing the right foods can help lower your triglycerides.  WHAT GENERAL GUIDELINES DO I NEED TO FOLLOW?  Lose weight if you are overweight.   Limit or avoid alcohol.   Fill one half of your plate with vegetables and green salads.   Limit fruit to two servings a day. Choose fruit instead of juice.   Make one fourth of your plate whole grains. Look for the word "whole" as the first word in the ingredient list.  Fill one fourth of your plate with lean protein foods.  Enjoy fatty fish (such as salmon, mackerel, sardines, and tuna)  three times a week.   Choose healthy fats.   Limit foods high in starch and sugar.  Eat more home-cooked food and less restaurant, buffet, and fast food.  Limit fried foods.  Cook foods using methods other than frying.  Limit saturated fats.  Check ingredient lists to avoid foods with partially hydrogenated oils (trans fats) in them. WHAT FOODS CAN I EAT?  Grains Whole grains, such as whole wheat or whole grain breads, crackers, cereals, and pasta. Unsweetened oatmeal, bulgur, barley, quinoa, or brown rice. Corn or whole wheat flour tortillas.  Vegetables Fresh or frozen vegetables (raw, steamed, roasted, or grilled). Green salads. Fruits All fresh, canned (in natural juice), or frozen fruits. Meat and Other Protein Products Ground beef (85% or leaner), grass-fed beef, or beef trimmed of fat. Skinless chicken or [place]. Ground chicken or [place]. Pork trimmed of fat. All fish and seafood. Eggs. Dried beans, peas, or lentils. Unsalted nuts or seeds. Unsalted canned or dry beans. Dairy Low-fat dairy products, such as skim or 1% milk, 2% or reduced-fat cheeses, low-fat ricotta or cottage cheese, or plain low-fat yogurt. Fats and Oils Tub margarines without trans fats. Light or reduced-fat mayonnaise and salad dressings. Avocado. Safflower, olive, or canola oils. Natural peanut or almond butter. The items listed above may not be a complete list of recommended foods or beverages. Contact your dietitian for more options. WHAT FOODS ARE NOT RECOMMENDED?  Grains White bread. White pasta. White rice. Cornbread. Bagels, pastries, and croissants. Crackers that contain trans fat. Vegetables White potatoes. Corn. Creamed or fried vegetables. Vegetables in a cheese sauce. Fruits Dried fruits. Canned fruit in light or heavy syrup. Fruit juice. Meat and Other Protein Products Fatty cuts of meat. Ribs, chicken wings, bacon, sausage, bologna, salami, chitterlings, fatback, hot dogs, bratwurst,  and packaged luncheon meats. Dairy Whole or 2% milk, cream, half-and-half, and cream cheese. Whole-fat or sweetened yogurt. Full-fat cheeses. Nondairy creamers and whipped toppings. Processed cheese, cheese spreads, or cheese curds. Sweets and Desserts Corn syrup, sugars, honey, and molasses. Candy. Jam and jelly. Syrup. Sweetened cereals. Cookies, pies, cakes, donuts, muffins, and ice cream. Fats and Oils Butter, stick margarine, lard, shortening, ghee, or bacon fat. Coconut, palm kernel, or palm oils. Beverages Alcohol. Sweetened drinks (such as sodas, lemonade, and fruit drinks or punches). The items listed above may not be a complete list of foods and beverages to avoid. Contact your dietitian for more information. Document Released: 08/13/2004 Document Revised: 10/31/2013 Document Reviewed: 08/30/2013 [facility] Patient Information 2015 [place], [place]. This information is not intended to replace advice given to you by your health care provider. Make sure you  discuss any questions you have with your health care provider.  

## 2015-02-13 NOTE — Progress Notes (Signed)
Pre visit review using our clinic review tool, if applicable. No additional management support is needed unless otherwise documented below in the visit note.  Chief Complaint  Patient presents with  . Annual Exam    last viit over 3 years ago  has form for insurance    HPI: Patient  Gregory Peck  44 y.o. comes in today for Preventive Health Care visit  Has not been seeen in over 3 years and here to reestablish and  Have pv  .  Has from for work  Sees cardiology for  Hx of afib intermittent use of meds rarely one a year or so  About once every 2 years as needed once a year  Sees Dr Graciela Husbands. Minimal back .  Issues  At times  Rectal irritation saw   Derm cream. Given  Has v v  No sig pain but some achy full tight feeling at times  / if should do anything with these  No ulcers  Active stands a bit  fam hx of   VV  Health Maintenance  Topic Date Due  . HIV Screening  02/08/2016 (Originally 11/27/1985)  . INFLUENZA VACCINE  06/10/2015  . TETANUS/TDAP  04/17/2018   Health Maintenance Review LIFESTYLE:  Exercise:   Not now  Tobacco/ETS: no Alcohol:  none Sugar beverages: on cassional 3 per week .  Sleep:  About 6.5    Busy  Drug use: no ROS:  GEN/ HEENT: No fever, significant weight changes sweats headaches vision problems hearing changes, CV/ PULM; No chest pain shortness of breath cough, syncope,edema  change in exercise tolerance. GI /GU: No adominal pain, vomiting, change in bowel habits. No blood in the stool. No significant GU symptoms. SKIN/HEME: ,no acute skin rashes suspicious lesions or bleeding. No lymphadenopathy, nodules, masses.  NEURO/ PSYCH:  No neurologic signs such as weakness numbness. No depression anxiety. IMM/ Allergy: No unusual infections.  Allergy .   REST of 12 system review negative except as per HPI   Past Medical History  Diagnosis Date  . History of syncope   . Asymptomatic varicose veins   . Atrial fibrillation   . Rectal bleed     Colonoscopy  age 74 hemorrhoids Buccini    Past Surgical History  Procedure Laterality Date  . Knee arthroscopy    . Varicolectomy  1992    Family History  Problem Relation Age of Onset  . Hypertension    . Hyperlipidemia    . Hearing loss    . Other      arrythmia    History   Social History  . Marital Status: Married    Spouse Name: N/A  . Number of Children: N/A  . Years of Education: N/A   Social History Main Topics  . Smoking status: Never Smoker   . Smokeless tobacco: Not on file  . Alcohol Use: No  . Drug Use: Not on file  . Sexual Activity: Not on file   Other Topics Concern  . 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 diltiazem (CARDIZEM) 60 MG tablet Take 1 tablet (60  mg total) by mouth every 6 (six) hours as needed. for atrial fibrillation (Patient not taking: Reported on 02/13/2015)  . flecainide (TAMBOCOR) 100 MG tablet Take 1 tablet (100 mg total) by mouth daily as needed.    EXAM:  BP 120/80 mmHg  Temp(Src) 97.6 F (36.4 C) (Oral)  Ht 6' 5.5" (1.969 m)  Wt 272 lb 14.4 oz (123.787 kg)  BMI 31.93 kg/m2  Body mass index is 31.93 kg/(m^2).  Physical Exam: Vital signs reviewed AVW:UJWJ is a well-developed well-nourished alert cooperative    who appearsr stated age in no acute distress.  HEENT: normocephalic atraumatic , Eyes: PERRL EOM's full, conjunctiva clear, Nares: paten,t no deformity discharge or tenderness., Ears: no deformity EAC's clear TMs with normal landmarks. Mouth: clear OP, no lesions, edema.  Moist mucous membranes. Dentition in adequate repair. NECK: supple without masses, thyromegaly or bruits. CHEST/PULM:  Clear to auscultation and percussion breath sounds equal no wheeze , rales or  rhonchi. No chest wall deformities or tenderness. CV: PMI is nondisplaced, S1 S2 no gallops, murmurs, rubs. Peripheral pulses are full without delay.No JVD .  ABDOMEN: Bowel sounds normal nontender  No guard or rebound, no hepato splenomegal no CVA tenderness.  No hernia. Extremtities:  No clubbing cyanosis or edema, no acute joint swelling or redness no focal atrophy has significant varicose veins  Left more than right no ulceration NEURO:  Oriented x3, cranial nerves 3-12 appear to be intact, no obvious focal weakness,gait within normal limits no abnormal reflexes or asymmetrical SKIN: No acute rashes normal turgor, color, no bruising or petechiae. PSYCH: Oriented, good eye contact, no obvious depression anxiety, cognition and judgment appear normal. LN: no cervical axillary inguinal adenopathy  Lab see scanned document  TC226,hdl33, ldl 152 tg 205 ratio 6.8 fbs 95 hga1c is 6  ASSESSMENT AND PLAN:  Discussed the following assessment and plan:  Visit for preventive health examination  Hyperlipidemia - dyslipidemic proglic consdier risk for dm   Varicose veins of legs - left more than right    some sx not severe but advise seeing vascular for evalution  and plan  - Plan: Ambulatory referral to Vascular Surgery  Elevated hemoglobin A1c - 6.0 pre diabetic range  lsi follow    Will do a referral for vein evaluation.  Offered nutrition eval but   Will work on his own.  From completed and signed  Counseled.  Patient Care Team: Madelin Headings, MD as PCP - General Duke Salvia, MD (Cardiology) Patient Instructions   Cholesterol and triglycerides are high .  Hg a1c is in  At risk for diabetes range  healhty weight loss to bmi below 130 at least  You will be contacted about  Vascular vein referral as discussed   Intensify lifestyle interventions. Healthy lifestyle includes : At least 150 minutes of exercise weeks  , weight at healthy levels, which is usually   BMI 19-25. Avoid trans  fats and processed foods;  Increase fresh fruits and veges to 5 servings per day. And avoid sweet beverages including tea and juice. Mediterranean diet with olive oil and nuts have been noted to be heart and brain healthy . Avoid tobacco products . Limit  alcohol to  7 per week for women and 14 servings for men.  Get adequate sleep . Wear seat belts . Don't text and drive .      Why follow it? Research shows. . Those who follow the Mediterranean diet have a reduced risk of heart disease  .  The diet is associated with a reduced incidence of Parkinson's and Alzheimer's diseases . People following the diet may have longer life expectancies and lower rates of chronic diseases  . The Dietary Guidelines for Americans recommends the Mediterranean diet as an eating plan to promote health and prevent disease  What Is the Mediterranean Diet?  . Healthy eating plan based on typical foods and recipes of Mediterranean-style cooking . The diet is primarily a plant based diet; these foods should make up a majority of meals   Starches - Plant based foods should make up a majority of meals - They are an important sources of vitamins, minerals, energy, antioxidants, and fiber - Choose whole grains, foods high in fiber and minimally processed items  - Typical grain sources include wheat, oats, barley, corn, brown rice, bulgar, farro, millet, polenta, couscous  - Various types of beans include chickpeas, lentils, fava beans, black beans, white beans   Fruits  Veggies - Large quantities of antioxidant rich fruits & veggies; 6 or more servings  - Vegetables can be eaten raw or lightly drizzled with oil and cooked  - Vegetables common to the traditional Mediterranean Diet include: artichokes, arugula, beets, broccoli, brussel sprouts, cabbage, carrots, celery, collard greens, cucumbers, eggplant, kale, leeks, lemons, lettuce, mushrooms, okra, onions, peas, peppers, potatoes, pumpkin, radishes, rutabaga, shallots,  spinach, sweet potatoes, turnips, zucchini - Fruits common to the Mediterranean Diet include: apples, apricots, avocados, cherries, clementines, dates, figs, grapefruits, grapes, melons, nectarines, oranges, peaches, pears, pomegranates, strawberries, tangerines  Fats - Replace butter and margarine with healthy oils, such as olive oil, canola oil, and tahini  - Limit nuts to no more than a handful a day  - Nuts include walnuts, almonds, pecans, pistachios, pine nuts  - Limit or avoid candied, honey roasted or heavily salted nuts - Olives are central to the [employer]iterranean diet - can be eaten whole or used in a variety of dishes   Meats Protein - Limiting red meat: no more than a few times a month - When eating red meat: choose lean cuts and keep the portion to the size of deck of cards - Eggs: approx. 0 to 4 times a week  - Fish and lean poultry: at least 2 a week  - Healthy protein sources include, chicken, [place], lean beef, lamb - Increase intake of seafood such as tuna, salmon, trout, mackerel, shrimp, scallops - Avoid or limit high fat processed meats such as sausage and bacon  Dairy - Include moderate amounts of low fat dairy products  - Focus on healthy dairy such as fat free yogurt, skim milk, low or reduced fat cheese - Limit dairy products higher in fat such as whole or 2% milk, cheese, ice cream  Alcohol - Moderate amounts of red wine is ok  - No more than 5 oz daily for women (all ages) and men older than age 44  - No more than 10 oz of wine daily for men younger than [address]65  Other - Limit sweets and other desserts  - Use herbs and spices instead of salt to flavor foods  - Herbs and spices common to the traditional Mediterranean Diet include: basil, bay leaves, chives, cloves, cumin, fennel, garlic, lavender, marjoram, mint, oregano, parsley, pepper, rosemary, sage, savory, sumac, tarragon, thyme   It's not just a diet, it's a lifestyle:  . The Mediterranean diet includes lifestyle  factors typical of those in the region  . Foods, drinks and meals are best eaten with others and  savored . Daily physical activity is important for overall good health . This could be strenuous exercise like running and aerobics . This could also be more leisurely activities such as walking, housework, yard-work, or taking the stairs . Moderation is the key; a balanced and healthy diet accommodates most foods and drinks . Consider portion sizes and frequency of consumption of certain foods   Meal Ideas & Options:  . Breakfast:  o Whole wheat toast or whole wheat English muffins with peanut butter & hard boiled egg o Steel cut oats topped with apples & cinnamon and skim milk  o Fresh fruit: banana, strawberries, melon, berries, peaches  o Smoothies: strawberries, bananas, greek yogurt, peanut butter o Low fat greek yogurt with blueberries and granola  o Egg white omelet with spinach and mushrooms o Breakfast couscous: whole wheat couscous, apricots, skim milk, cranberries  . Sandwiches:  o Hummus and grilled vegetables (peppers, zucchini, squash) on whole wheat bread   o Grilled chicken on whole wheat pita with lettuce, tomatoes, cucumbers or tzatziki  o Tuna salad on whole wheat bread: tuna salad made with greek yogurt, olives, red peppers, capers, green onions o Garlic rosemary lamb pita: lamb sauted with garlic, rosemary, salt & pepper; add lettuce, cucumber, greek yogurt to pita - flavor with lemon juice and black pepper  . Seafood:  o Mediterranean grilled salmon, seasoned with garlic, basil, parsley, lemon juice and black pepper o Shrimp, lemon, and spinach whole-grain pasta salad made with low fat greek yogurt  o Seared scallops with lemon orzo  o Seared tuna steaks seasoned salt, pepper, coriander topped with tomato mixture of olives, tomatoes, olive oil, minced garlic, parsley, green onions and cappers  . Meats:  o Herbed greek chicken salad with kalamata olives, cucumber, feta    o Red bell peppers stuffed with spinach, bulgur, lean ground beef (or lentils) & topped with feta   o Kebabs: skewers of chicken, tomatoes, onions, zucchini, squash  o [place] burgers: made with red onions, mint, dill, lemon juice, feta cheese topped with roasted red peppers . Vegetarian o Cucumber salad: cucumbers, artichoke hearts, celery, red onion, feta cheese, tossed in olive oil & lemon juice  o Hummus and whole grain pita points with a greek salad (lettuce, tomato, feta, olives, cucumbers, red onion) o Lentil soup with celery, carrots made with vegetable broth, garlic, salt and pepper  o Tabouli salad: parsley, bulgur, mint, scallions, cucumbers, tomato, radishes, lemon juice, olive oil, salt and pepper.       Food Choices to Lower Your Triglycerides  Triglycerides are a type of fat in your blood. High levels of triglycerides can increase the risk of heart disease and stroke. If your triglyceride levels are high, the foods you eat and your eating habits are very important. Choosing the right foods can help lower your triglycerides.  WHAT GENERAL GUIDELINES DO I NEED TO FOLLOW?  Lose weight if you are overweight.   Limit or avoid alcohol.   Fill one half of your plate with vegetables and green salads.   Limit fruit to two servings a day. Choose fruit instead of juice.   Make one fourth of your plate whole grains. Look for the word "whole" as the first word in the ingredient list.  Fill one fourth of your plate with lean protein foods.  Enjoy fatty fish (such as salmon, mackerel, sardines, and tuna) three times a week.   Choose healthy fats.   Limit foods high in starch and sugar.  Eat more home-cooked food and less restaurant, buffet, and fast food.  Limit fried foods.  Cook foods using methods other than frying.  Limit saturated fats.  Check ingredient lists to avoid foods with partially hydrogenated oils (trans fats) in them. WHAT FOODS CAN I EAT?   Grains Whole grains, such as whole wheat or whole grain breads, crackers, cereals, and pasta. Unsweetened oatmeal, bulgur, barley, quinoa, or brown rice. Corn or whole wheat flour tortillas.  Vegetables Fresh or frozen vegetables (raw, steamed, roasted, or grilled). Green salads. Fruits All fresh, canned (in natural juice), or frozen fruits. Meat and Other Protein Products Ground beef (85% or leaner), grass-fed beef, or beef trimmed of fat. Skinless chicken or [place]. Ground chicken or [place]. Pork trimmed of fat. All fish and seafood. Eggs. Dried beans, peas, or lentils. Unsalted nuts or seeds. Unsalted canned or dry beans. Dairy Low-fat dairy products, such as skim or 1% milk, 2% or reduced-fat cheeses, low-fat ricotta or cottage cheese, or plain low-fat yogurt. Fats and Oils Tub margarines without trans fats. Light or reduced-fat mayonnaise and salad dressings. Avocado. Safflower, olive, or canola oils. Natural peanut or almond butter. The items listed above may not be a complete list of recommended foods or beverages. Contact your dietitian for more options. WHAT FOODS ARE NOT RECOMMENDED?  Grains White bread. White pasta. White rice. Cornbread. Bagels, pastries, and croissants. Crackers that contain trans fat. Vegetables White potatoes. Corn. Creamed or fried vegetables. Vegetables in a cheese sauce. Fruits Dried fruits. Canned fruit in light or heavy syrup. Fruit juice. Meat and Other Protein Products Fatty cuts of meat. Ribs, chicken wings, bacon, sausage, bologna, salami, chitterlings, fatback, hot dogs, bratwurst, and packaged luncheon meats. Dairy Whole or 2% milk, cream, half-and-half, and cream cheese. Whole-fat or sweetened yogurt. Full-fat cheeses. Nondairy creamers and whipped toppings. Processed cheese, cheese spreads, or cheese curds. Sweets and Desserts Corn syrup, sugars, honey, and molasses. Candy. Jam and jelly. Syrup. Sweetened cereals. Cookies, pies, cakes, donuts,  muffins, and ice cream. Fats and Oils Butter, stick margarine, lard, shortening, ghee, or bacon fat. Coconut, palm kernel, or palm oils. Beverages Alcohol. Sweetened drinks (such as sodas, lemonade, and fruit drinks or punches). The items listed above may not be a complete list of foods and beverages to avoid. Contact your dietitian for more information. Document Released: 08/13/2004 Document Revised: 10/31/2013 Document Reviewed: 08/30/2013 [facility] Patient Information 2015 [place], [place]. This information is not intended to replace advice given to you by your health care provider. Make sure you discuss any questions you have with your health care provider.     Neta Mends. Mikaella Escalona M.D.

## 2015-02-18 ENCOUNTER — Telehealth: Payer: Self-pay | Admitting: Internal Medicine

## 2015-02-18 NOTE — Telephone Encounter (Signed)
New message    Wife calling  Would like to be seen today,.    C/O heart out of rhythm since 4 pm on yesterday.  Feeling like he going to pass out. Pt called out of work today.   Wife  Aware the nurse / MD are not in the office today.

## 2015-02-18 NOTE — Telephone Encounter (Signed)
Wife calling stating Mr. Willden has been out of rhythm since 5 pm yesterday.  He took one Flecainide yesterday at 5, then 30 min later took both Diltiazem and Flecainide and was still out of rhythm. At 2:00 AM this morning took another Flecainide.  States now he is very weak, can't walk to BR, using urinal. Feels like he is going to pass out. No CP but is SOB.  BP manually is 88/ ? And unable to get HR because so irregular.  Spoke w/Brittany Simmons,PA who advises for them to go to ER.  When called wife back she states that they really don't want to go to ER because they have done this before and not really helped.  Spoke w/Brittany again who still advise to go to ER. Advised wife that since he is symptomatic and low BP can't take diltiazem and has taken max on Flecainide. We can't give him IV medications here in office and monitor him  She finally agreed [facility].  Notified Trish that he will be coming to ER.

## 2015-02-26 ENCOUNTER — Other Ambulatory Visit: Payer: Self-pay

## 2015-02-26 MED ORDER — FLECAINIDE ACETATE 100 MG PO TABS: 100.0000 mg | ORAL_TABLET | Freq: Every day | ORAL | Status: DC | PRN

## 2015-03-05 ENCOUNTER — Encounter: Payer: Self-pay | Admitting: Internal Medicine

## 2015-03-06 ENCOUNTER — Other Ambulatory Visit: Payer: Self-pay | Admitting: *Deleted

## 2015-03-06 DIAGNOSIS — I8390 Asymptomatic varicose veins of unspecified lower extremity: Secondary | ICD-10-CM

## 2015-03-14 ENCOUNTER — Encounter: Payer: Self-pay | Admitting: Internal Medicine

## 2015-03-14 ENCOUNTER — Ambulatory Visit (INDEPENDENT_AMBULATORY_CARE_PROVIDER_SITE_OTHER): Payer: PRIVATE HEALTH INSURANCE | Admitting: Internal Medicine

## 2015-03-14 VITALS — BP 119/79 | HR 50 | Ht 79.0 in | Wt 268.2 lb

## 2015-03-14 DIAGNOSIS — I480 Paroxysmal atrial fibrillation: Secondary | ICD-10-CM

## 2015-03-14 NOTE — Progress Notes (Signed)
      Patient Care Team: Gregory HeadingsWanda K Panosh, MD as PCP - General Gregory SalviaSteven C Ruffin Lada, MD (Cardiology)   HPI  Gregory Peck is a 44 y.o. male seen in followup for paroxysmal atrial fibrillation.  He takes flecainide p.r.n. with the spells and when it lasts more than a little bit he takes 300 mg at a time. All ut one of the recent spells have   terminated with in 3 hours.   However, there was an episode in mid April with this did not occur. This episode occurred at the end of the day. It was not particularly noteworthy. He was associated with profound lightheadedness and ultimately documented orthostatic hypotension by EMS. His conversion was actually recorded. Notably this strip demonstrates atrial fibrillation and not atrial flutter. The latter had been a concern as a potential consequence of flecainide use.         Past Medical History  Diagnosis Date  . History of syncope   . Asymptomatic varicose veins   . Atrial fibrillation   . Rectal bleed     Colonoscopy age 44 hemorrhoids Buccini    Past Surgical History  Procedure Laterality Date  . Knee arthroscopy    . Varicolectomy  1992    Current Outpatient Prescriptions  Medication Sig Dispense Refill  . diltiazem (CARDIZEM) 60 MG tablet Take 1 tablet (60 mg total) by mouth every 6 (six) hours as needed. for atrial fibrillation 30 tablet 0  . flecainide (TAMBOCOR) 100 MG tablet Take 1 tablet (100 mg total) by mouth daily as needed. 30 tablet 1   No current facility-administered medications for this visit.    No Known Allergies  Review of Systems negative except from HPI and PMH  Physical Exam BP 119/79 mmHg  Pulse 50  Ht 6\' 7"  (2.007 m)  Wt 268 lb 3.2 oz (121.655 kg)  BMI 30.20 kg/m2 Well developed and nourished in no acute distress HENT normal Neck supple with JVP-flat Clear Regular rate and rhythm, no murmurs or gallops Abd-soft with active BS No Clubbing cyanosis edema Skin-warm and dry A & Oriented  Grossly  normal sensory and motor function  NSR  50 intervals 17/11/42 Otherwise normal   Assessment and  Plan Paroxysmal atrial fibrillation  We spent a long time discussing his atrial fibrillation. I reviewed the strips from EMS that demonstrated atrial fibrillation but thankfully not flutter in the context of his severe symptoms.  For now, we will continue on a protocol of using flecainide 300 mg with recurrence of atrial fibrillation.  I've also suggested that he sat down with Dr. Fawn Peck and discussed the potential benefits of catheter ablation of his atrial fibrillation substrate given the severity of symptoms albeit a variable and his young age.  We spent more than 50% of our >45 min visit in face to face counseling regarding the above  We will arrange for an echocardiogram to assess left atrial dimensions

## 2015-03-14 NOTE — Patient Instructions (Signed)
Medication Instructions:  Your physician recommends that you continue on your current medications as directed. Please refer to the Current Medication list given to you today.  Labwork: None ordered  Testing/Procedures: None ordered  Follow-Up: Your physician wants you to follow-up in: 1 year with Dr. Graciela HusbandsKlein.  You will receive a reminder letter in the mail two months in advance. If you don't receive a letter, please call our office to schedule the follow-up appointment.  You have been referred to Dr. Johney FrameAllred to discuss possible ablation.   Thank you for choosing [facility]!!

## 2015-03-18 ENCOUNTER — Other Ambulatory Visit: Payer: Self-pay | Admitting: *Deleted

## 2015-03-18 DIAGNOSIS — I4891 Unspecified atrial fibrillation: Secondary | ICD-10-CM

## 2015-04-01 ENCOUNTER — Institutional Professional Consult (permissible substitution): Payer: PRIVATE HEALTH INSURANCE | Admitting: Internal Medicine

## 2015-04-02 ENCOUNTER — Other Ambulatory Visit: Payer: Self-pay

## 2015-04-02 ENCOUNTER — Ambulatory Visit (HOSPITAL_COMMUNITY): Payer: PRIVATE HEALTH INSURANCE | Attending: Internal Medicine

## 2015-04-02 DIAGNOSIS — I4891 Unspecified atrial fibrillation: Secondary | ICD-10-CM

## 2015-04-05 ENCOUNTER — Encounter: Payer: PRIVATE HEALTH INSURANCE | Admitting: Vascular Surgery

## 2015-04-05 ENCOUNTER — Encounter (HOSPITAL_COMMUNITY): Payer: PRIVATE HEALTH INSURANCE

## 2015-04-09 ENCOUNTER — Telehealth: Payer: Self-pay | Admitting: Internal Medicine

## 2015-04-09 ENCOUNTER — Ambulatory Visit (INDEPENDENT_AMBULATORY_CARE_PROVIDER_SITE_OTHER): Payer: PRIVATE HEALTH INSURANCE | Admitting: Internal Medicine

## 2015-04-09 ENCOUNTER — Encounter: Payer: Self-pay | Admitting: Internal Medicine

## 2015-04-09 VITALS — BP 102/74 | Temp 98.2°F | Ht 77.5 in | Wt 267.2 lb

## 2015-04-09 DIAGNOSIS — L509 Urticaria, unspecified: Secondary | ICD-10-CM | POA: Diagnosis not present

## 2015-04-09 DIAGNOSIS — T148 Other injury of unspecified body region: Secondary | ICD-10-CM | POA: Diagnosis not present

## 2015-04-09 DIAGNOSIS — W57XXXA Bitten or stung by nonvenomous insect and other nonvenomous arthropods, initial encounter: Secondary | ICD-10-CM

## 2015-04-09 MED ORDER — METHYLPREDNISOLONE ACETATE 80 MG/ML IJ SUSP
120.0000 mg | Freq: Once | INTRAMUSCULAR | Status: AC
  Administered 2015-04-09: 120 mg via INTRAMUSCULAR

## 2015-04-09 NOTE — Patient Instructions (Signed)
Uncertain cause  Of hives can last for days.  No obvious incitors or infection . Cortisone shot today to facilitate resolution  Take zyrtec ( gemeric ok ) every day 10 mg and ranitidine 150 mg twice a day until resolveed can add on benadryl if needed for break through   But should be going  Away in the next 48 hours .  If fever  reslapsing recurring then contact and do allergy referral. Opinion.  Hives Hives are itchy, red, swollen areas of the skin. They can vary in size and location on your body. Hives can come and go for hours or several days (acute hives) or for several weeks (chronic hives). Hives do not spread from person to person (noncontagious). They may get worse with scratching, exercise, and emotional stress. CAUSES   Allergic reaction to food, additives, or drugs.  Infections, including the common cold.  Illness, such as vasculitis, lupus, or thyroid disease.  Exposure to sunlight, heat, or cold.  Exercise.  Stress.  Contact with chemicals. SYMPTOMS   Red or white swollen patches on the skin. The patches may change size, shape, and location quickly and repeatedly.  Itching.  Swelling of the hands, feet, and face. This may occur if hives develop deeper in the skin. DIAGNOSIS  Your caregiver can usually tell what is wrong by performing a physical exam. Skin or blood tests may also be done to determine the cause of your hives. In some cases, the cause cannot be determined. TREATMENT  Mild cases usually get better with medicines such as antihistamines. Severe cases may require an emergency epinephrine injection. If the cause of your hives is known, treatment includes avoiding that trigger.  HOME CARE INSTRUCTIONS   Avoid causes that trigger your hives.  Take antihistamines as directed by your caregiver to reduce the severity of your hives. Non-sedating or low-sedating antihistamines are usually recommended. Do not drive while taking an antihistamine.  Take any other  medicines prescribed for itching as directed by your caregiver.  Wear loose-fitting clothing.  Keep all follow-up appointments as directed by your caregiver. SEEK MEDICAL CARE IF:   You have persistent or severe itching that is not relieved with medicine.  You have painful or swollen joints. SEEK IMMEDIATE MEDICAL CARE IF:   You have a fever.  Your tongue or lips are swollen.  You have trouble breathing or swallowing.  You feel tightness in the throat or chest.  You have abdominal pain. These problems may be the first sign of a life-threatening allergic reaction. Call your local emergency services (911 in [place] MAKE SURE YOU:   Understand these instructions.  Will watch your condition.  Will get help right away if you are not doing well or get worse. Document Released: 10/26/2005 Document Revised: 10/31/2013 Document Reviewed: 01/19/2012 [facility] Patient Information 2015 [place], [place]. This information is not intended to replace advice given to you by your health care provider. Make sure you discuss any questions you have with your health care provider.

## 2015-04-09 NOTE — Telephone Encounter (Signed)
Appointment scheduled with Dr Fabian SharpPanosh for 11:30 04/09/15

## 2015-04-09 NOTE — Telephone Encounter (Signed)
[facility] - Client TELEPHONE ADVICE RECORD [facility] Patient Name: Retta DionesMONTE Gabler DOB: Nov 05, 1971 Initial Comment Caller states her husband broke out in hives yesterday, took benadryl but not going away Nurse Assessment Nurse: Elijah Birkaldwell, RN, Stark BrayLynda Date/Time (Eastern Time): 04/09/2015 8:23:12 AM Confirm and document reason for call. If symptomatic, describe symptoms. ---Caller states her husband broke out in hives all over yesterday, took Benadryl, but not going away. Has had this happen before. His knuckles on his hands have swollen. Not sure if he has fever. Caller is not with patient currently. Has the patient traveled out of the country within the last 30 days? ---No Does the patient require triage? ---Yes Related visit to physician within the last 2 weeks? ---No Does the PT have any chronic conditions? (i.e. diabetes, asthma, etc.) ---No Guidelines Guideline Title Affirmed Question Affirmed Notes Hives Joint swelling Final Disposition User See Physician within 24 Hours Buncetonaldwell, RN, Stark BrayLynda Comments Caller states that her husband has no food allergies that they are aware of. As far as she knows, no swelling of tongue, or new hoarseness/cough. Did mention swelling of knuckles on hand. Her husband is at work now, but told her he was not feeling well.

## 2015-04-09 NOTE — Addendum Note (Signed)
Addended by: Raj JanusADKINS, Laurana Magistro T on: 04/09/2015 01:37 PM   Modules accepted: Orders

## 2015-04-09 NOTE — Progress Notes (Signed)
Pre visit review using our clinic review tool, if applicable. No additional management support is needed unless otherwise documented below in the visit note.  Chief Complaint  Patient presents with  . Urticaria    Started yesterday.  Has taken benadryl.    HPI: Patient Gregory Peck  comes in today for SDA for  new problem evaluation.  Awakes yesterday   Am . With itching tingling and then hives mostly  Torso  Used benadry 50 mg 230 am but still present and very dragged out no resp sx but lower lip some swelling   benadry l drags him out and has to travel this week .  Remote hx of  Hives  ? If shellfish went afay after one  Shellfish  Other time wndies red onions   ? [place]  ressaturant . Red onions.   Also strawberries  Little bottoim lip and soreness achy joints.  Hands  Had middle finger swell yesterday at pool  Has has of tick bitye behind left knee no rash there  Removed them   ROS: See pertinent positives and negatives per HPI.no fever st other acute changes   hasn taken heart med for over a month  No new meds or exposures  No cough nvd   Past Medical History  Diagnosis Date  . History of syncope   . Asymptomatic varicose veins   . Atrial fibrillation   . Rectal bleed     Colonoscopy age 44 hemorrhoids Buccini    Family History  Problem Relation Age of Onset  . Hypertension    . Hyperlipidemia    . Hearing loss    . Other      arrythmia    History   Social History  . Marital Status: Married    Spouse Name: N/A  . Number of Children: N/A  . Years of Education: N/A   Social History Main Topics  . Smoking status: Never Smoker   . Smokeless tobacco: Not on file  . Alcohol Use: No  . Drug Use: Not on file  . Sexual Activity: Not on file   Other Topics Concern  . 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 flecainide (TAMBOCOR) 100 MG tablet Take 1 tablet (100 mg total) by mouth daily as needed. 30 tablet 1  . diltiazem (CARDIZEM) 60 MG tablet Take 1 tablet (60 mg total) by mouth every 6 (six) hours as needed. for atrial fibrillation (Patient not taking: Reported on 04/09/2015) 30 tablet 0   No facility-administered medications prior to visit.     EXAM:  BP 102/74 mmHg  Temp(Src) 98.2 F (36.8 C) (Oral)  Ht 6' 5.5" (1.969 m)  Wt 267 lb 3.2 oz (121.201 kg)  BMI 31.26 kg/m2  Body mass index is 31.26 kg/(m^2).  GENERAL: vitals reviewed and listed above, alert, oriented, appears well hydrated and in no acute distress with soe itching red flat hives  Arms trunk on e on face  plas some hyperemia  No petechia HEENT: atraumatic, conjunctiva  clear, no obvious abnormalities on inspection of external nose and ears OP :  no lesion edema or exudate  NECK: no obvious masses on inspection palpation  LUNGS: clear to auscultation bilaterally, no wheezes, rales or rhonchi, good air movement CV: HRRR, no clubbing cyanosis or  peripheral edema nl cap refill  MS: moves all extremities without noticeable focal  Abnormality  Says hands feel stiff  Legs VV as before left more than right  PSYCH: pleasant and cooperative, no obvious depression or anxiety  ASSESSMENT AND PLAN:  Discussed the following assessment and plan:  Hives - uncertain cause  disc poss risk benefit of meds if recurent consider all referral  Tick bite hx - no evidence of rmsf or lyme fever typical ras etc . may not be related unless alpha gal allergy  -Patient advised to return or notify health care team  if symptoms worsen ,persist or new concerns arise.  Patient Instructions  Uncertain cause  Of hives can last for days.  No obvious incitors or infection . Cortisone shot today to facilitate resolution  Take  zyrtec ( gemeric ok ) every day 10 mg and ranitidine 150 mg twice a day until resolveed can add on benadryl if needed for break through   But should be going  Away in the next 48 hours .  If fever  reslapsing recurring then contact and do allergy referral. Opinion.  Hives Hives are itchy, red, swollen areas of the skin. They can vary in size and location on your body. Hives can come and go for hours or several days (acute hives) or for several weeks (chronic hives). Hives do not spread from person to person (noncontagious). They may get worse with scratching, exercise, and emotional stress. CAUSES   Allergic reaction to food, additives, or drugs.  Infections, including the common cold.  Illness, such as vasculitis, lupus, or thyroid disease.  Exposure to sunlight, heat, or cold.  Exercise.  Stress.  Contact with chemicals. SYMPTOMS   Red or white swollen patches on the skin. The patches may change size, shape, and location quickly and repeatedly.  Itching.  Swelling of the hands, feet, and face. This may occur if hives develop deeper in the skin. DIAGNOSIS  Your caregiver can usually tell what is wrong by performing a physical exam. Skin or blood tests may also be done to determine the cause of your hives. In some cases, the cause cannot be determined. TREATMENT  Mild cases usually get better with medicines such as antihistamines. Severe cases may require an emergency epinephrine injection. If the cause of your hives is known, treatment includes avoiding that trigger.  HOME CARE INSTRUCTIONS   Avoid causes that trigger your hives.  Take antihistamines as directed by your caregiver to reduce the severity of your hives. Non-sedating or low-sedating antihistamines are usually recommended. Do not drive while taking an antihistamine.  Take any other medicines prescribed for itching as directed by your caregiver.  Wear loose-fitting clothing.  Keep all follow-up appointments as  directed by your caregiver. SEEK MEDICAL CARE IF:   You have persistent or severe itching that is not relieved with medicine.  You have painful or swollen joints. SEEK IMMEDIATE MEDICAL CARE IF:   You have a fever.  Your tongue or lips are swollen.  You have trouble breathing or swallowing.  You feel tightness in the throat or chest.  You have abdominal pain. These problems may be the first sign of a life-threatening allergic reaction. Call your local emergency services (911 in [place] MAKE SURE YOU:   Understand these instructions.  Will watch your condition.  Will get help right away if you are not doing well or get worse. Document Released: 10/26/2005 Document Revised: 10/31/2013 Document Reviewed: 01/19/2012 [facility] Patient Information 2015 [place], [place]. This information is not intended to replace advice given to you by your health care provider. Make sure you discuss any questions you have with your health care provider.      Neta Mends. Gregory Peck M.D.

## 2015-04-10 ENCOUNTER — Ambulatory Visit (INDEPENDENT_AMBULATORY_CARE_PROVIDER_SITE_OTHER): Payer: PRIVATE HEALTH INSURANCE | Admitting: Internal Medicine

## 2015-04-10 ENCOUNTER — Encounter: Payer: Self-pay | Admitting: Internal Medicine

## 2015-04-10 VITALS — BP 106/74 | HR 62 | Ht 79.0 in | Wt 267.8 lb

## 2015-04-10 DIAGNOSIS — I480 Paroxysmal atrial fibrillation: Secondary | ICD-10-CM

## 2015-04-10 DIAGNOSIS — R55 Syncope and collapse: Secondary | ICD-10-CM | POA: Diagnosis not present

## 2015-04-10 NOTE — Patient Instructions (Signed)
Medication Instructions:  Your physician recommends that you continue on your current medications as directed. Please refer to the Current Medication list given to you today.   Labwork: None ordered  Testing/Procedures:  Your physician has recommended that you have an ablation. Catheter ablation is a medical procedure used to treat some cardiac arrhythmias (irregular heartbeats). During catheter ablation, a long, thin, flexible tube is put into a blood vessel in your groin (upper thigh), or neck. This tube is called an ablation catheter. It is then guided to your heart through the blood vessel. Radio frequency waves destroy small areas of heart tissue where abnormal heartbeats may cause an arrhythmia to start. Please see the instruction sheet given to you today.    Follow-Up: Your physician recommends that you schedule a follow-up appointment in: 3 months with Dr Johney FrameAllred   Any Other Special Instructions Will Be Listed Below (If Applicable). Atrial Fibrillation Ablation   CPT code [id number]

## 2015-04-12 NOTE — Progress Notes (Signed)
Electrophysiology Office Note   Date:  04/12/2015   ID:  Gregory Peck, DOB [birth year], MRN [id number]  PCP:  Lorretta HarpPANOSH,[name], MD  Primary Electrophysiologist:  Dr Graciela HusbandsKlein   Chief Complaint  Patient presents with  . PAF     History of Present Illness: Gregory Peck is a 44 y.o. male who presents today for electrophysiology evaluation.   The patient has symptomatic paroxysmal atrial fibrillation.  He has had atrial fibrillation for several years.  He has had difficulty with tachypalpitations and fatigue.  He has tried flecainide without real success.  He has not tolerated daily medicine.  He has an occasional arrhythmia which he describes as "more aggressive" which is poorly tolerated with presyncope.  This is felt to possibly be due to rapidly conducting atrial flutter.  He finds that his arrhythmia is worsened by lack of sleep or stress.  He has episodes every few months lasting up to 18 hours.  He continues to take flecainide as a "pill in pocket" option.  He has had prior syncope which is felt to be due to a rapid conduction of his arrhythmia by Dr Graciela HusbandsKlein.   Today, he denies symptoms of palpitations, chest pain, shortness of breath, orthopnea, PND, lower extremity edema, claudication, dizziness, presyncope, syncope, bleeding, or neurologic sequela. The patient is tolerating medications without difficulties and is otherwise without complaint today.    Past Medical History  Diagnosis Date  . History of syncope   . Asymptomatic varicose veins   . Atrial fibrillation   . Rectal bleed     Colonoscopy age 44 hemorrhoids Buccini   Past Surgical History  Procedure Laterality Date  . Knee arthroscopy    . Varicolectomy  1992     Current Outpatient Prescriptions  Medication Sig Dispense Refill  . flecainide (TAMBOCOR) 100 MG tablet Take 1 tablet (100 mg total) by mouth daily as needed. (Patient taking differently: Take 100 mg by mouth daily as needed (AFIB). ) 30 tablet 1    No current facility-administered medications for this visit.    Allergies:   Review of patient's allergies indicates no known allergies.   Social History:  The patient  reports that he has never smoked. He does not have any smokeless tobacco history on file. He reports that he does not drink alcohol.   Family History:  The patient's  family history includes Hearing loss in an other family member; Hyperlipidemia in an other family member; Hypertension in an other family member; Other in an other family member.    ROS:  Please see the history of present illness.   All other systems are reviewed and negative.    PHYSICAL EXAM: VS:  BP 106/74 mmHg  Pulse 62  Ht 6\' 7"  (2.007 m)  Wt 121.473 kg (267 lb 12.8 oz)  BMI 30.16 kg/m2 , BMI Body mass index is 30.16 kg/(m^2). GEN: Well nourished, well developed, in no acute distress, very tall HEENT: normal Neck: no JVD, carotid bruits, or masses Cardiac: RRR; no murmurs, rubs, or gallops,no edema  Respiratory:  clear to auscultation bilaterally, normal work of breathing GI: soft, nontender, nondistended, + BS MS: no deformity or atrophy Skin: warm and dry, significant varicosities are noted in his legs Neuro:  Strength and sensation are intact Psych: euthymic mood, full affect  EKG:  EKG is ordered today. The ekg ordered today shows sinus rhythm   Recent Labs: No results found for requested labs within last 365 days.    Lipid Panel  Component Value Date/Time   CHOL 201* 07/01/2011 0902   TRIG 118.0 07/01/2011 0902   HDL 39.00* 07/01/2011 0902   CHOLHDL 5 07/01/2011 0902   VLDL 23.6 07/01/2011 0902   LDLCALC 121* 04/26/2008 0906   LDLDIRECT 141.3 07/01/2011 0902     Wt Readings from Last 3 Encounters:  04/10/15 121.473 kg (267 lb 12.8 oz)  04/09/15 121.201 kg (267 lb 3.2 oz)  03/14/15 121.655 kg (268 lb 3.2 oz)      Other studies Reviewed: Additional studies/ records that were reviewed today include: Dr Koren Bound notes   Review of the above records today demonstrates: echo 5/16 reveals preserved EF, LA 44 mm   ASSESSMENT AND PLAN:  1.  Paroxysmal atrial fibrillation/ atrial flutter The patient has symptomatic recurrent atrial fibrillation.  He may also have atrial flutter by symptoms though this has not been as well documented.  He has failed medical therapy with flecainide and diltiazem.  Further medical therapy is limited by prior syncope as well as fatigue.  I agree with Dr Graciela Husbands that ablation is a good option for this patient.  Given his rapid arrhythmia and symptoms, I think that it is medically necessary to maintain sinus rhythm long term. Therapeutic strategies for afib including medicine and ablation were discussed in detail with the patient today. Risk, benefits, and alternatives to EP study and radiofrequency ablation were also discussed in detail today. These risks include but are not limited to stroke, bleeding, vascular damage, tamponade, perforation, damage to the esophagus, lungs, and other structures, pulmonary vein stenosis, worsening renal function, and death. The patient understands these risk and wishes to consider ablation in October (based on work related timing).  He is aware that he would need to start anticoagulation (Xarelto) 3 weeks prior to ablation.  He will contact our office if he decides to proceed with ablation in the interim.  Otherwise, I will see him back in 3 months for further discussion.  Today, I have spent 40 minutes with the patient discussing atrial fibrillation management.  More than 50% of the visit time today was spent on this issue.    Current medicines are reviewed at length with the patient today.   The patient does not have concerns regarding his medicines.  The following changes were made today:  none  Signed, Hillis Range, MD  04/12/2015 12:09 AM     [facility] [address] [place] [address] [phone] (office) [phone]  (fax)

## 2015-04-15 ENCOUNTER — Other Ambulatory Visit (HOSPITAL_COMMUNITY): Payer: PRIVATE HEALTH INSURANCE

## 2015-04-22 ENCOUNTER — Encounter: Payer: Self-pay | Admitting: Vascular Surgery

## 2015-04-24 ENCOUNTER — Inpatient Hospital Stay (HOSPITAL_COMMUNITY): Admission: RE | Admit: 2015-04-24 | Payer: PRIVATE HEALTH INSURANCE | Source: Ambulatory Visit

## 2015-04-24 ENCOUNTER — Encounter: Payer: PRIVATE HEALTH INSURANCE | Admitting: Vascular Surgery

## 2015-05-06 ENCOUNTER — Telehealth: Payer: Self-pay | Admitting: Internal Medicine

## 2015-05-06 NOTE — Telephone Encounter (Signed)
Spoke with wife and the form was corrected while her husband was still here.  He was to tell her as she had the wrong form but he forgot.

## 2015-05-06 NOTE — Telephone Encounter (Signed)
New message      Pt was given another patients action of care. Please call to advise of his care plan.

## 2015-07-12 ENCOUNTER — Telehealth: Payer: Self-pay | Admitting: Internal Medicine

## 2015-07-12 NOTE — Telephone Encounter (Signed)
New Message    Pt dont want to schedule appt until   It is determine if he is going to have the Catheter Ablation for the friday before Thanksgiving  Please call patient to

## 2015-07-17 ENCOUNTER — Ambulatory Visit: Payer: PRIVATE HEALTH INSURANCE | Admitting: Internal Medicine

## 2015-07-17 NOTE — Telephone Encounter (Signed)
lmom for patient to return my call.  Not sure how to take the message but I can not schedule an ablation without him being seen first as it has been 3 months since his last OV.  Dr Johney Frame is not in the hospital the [facility] prior to Thanksgiving 11/18

## 2015-07-26 NOTE — Telephone Encounter (Signed)
F/u   Pt returning [facility] phone call. Pt did not want to sched appt w/ Dr Johney Frame until speaking w/ Tresa Endo. Please call back on cell # first [phone], if not avail can try home #. Please call back and discuss.

## 2015-07-26 NOTE — Telephone Encounter (Signed)
Dr Johney Frame says he will need an OV prior on 09/18/15---Melissa to call [facility] 11/16 Ablation 09/26/15

## 2015-08-28 ENCOUNTER — Encounter: Payer: Self-pay | Admitting: Internal Medicine

## 2015-09-09 ENCOUNTER — Encounter: Payer: Self-pay | Admitting: Internal Medicine

## 2015-09-18 ENCOUNTER — Ambulatory Visit: Payer: PRIVATE HEALTH INSURANCE | Admitting: Internal Medicine

## 2015-09-26 ENCOUNTER — Encounter (HOSPITAL_COMMUNITY): Admission: RE | Payer: Self-pay | Source: Ambulatory Visit

## 2015-09-26 ENCOUNTER — Ambulatory Visit (HOSPITAL_COMMUNITY)
Admission: RE | Admit: 2015-09-26 | Payer: PRIVATE HEALTH INSURANCE | Source: Ambulatory Visit | Admitting: Internal Medicine

## 2015-09-26 SURGERY — ATRIAL FIBRILLATION ABLATION
Anesthesia: Monitor Anesthesia Care

## 2015-10-01 ENCOUNTER — Encounter (HOSPITAL_COMMUNITY): Payer: Self-pay | Admitting: Emergency Medicine

## 2015-10-01 ENCOUNTER — Observation Stay (HOSPITAL_COMMUNITY)
Admission: EM | Admit: 2015-10-01 | Discharge: 2015-10-02 | Disposition: A | Discharge status: Home / Self Care | Payer: PRIVATE HEALTH INSURANCE | Attending: Internal Medicine | Admitting: Internal Medicine

## 2015-10-01 ENCOUNTER — Telehealth: Payer: Self-pay | Admitting: Physician Assistant

## 2015-10-01 ENCOUNTER — Emergency Department (HOSPITAL_COMMUNITY): Payer: PRIVATE HEALTH INSURANCE

## 2015-10-01 ENCOUNTER — Telehealth: Payer: Self-pay | Admitting: Internal Medicine

## 2015-10-01 DIAGNOSIS — I480 Paroxysmal atrial fibrillation: Secondary | ICD-10-CM | POA: Diagnosis not present

## 2015-10-01 DIAGNOSIS — I4892 Unspecified atrial flutter: Principal | ICD-10-CM | POA: Insufficient documentation

## 2015-10-01 DIAGNOSIS — D72829 Elevated white blood cell count, unspecified: Secondary | ICD-10-CM | POA: Diagnosis not present

## 2015-10-01 DIAGNOSIS — R079 Chest pain, unspecified: Secondary | ICD-10-CM | POA: Diagnosis present

## 2015-10-01 DIAGNOSIS — I214 Non-ST elevation (NSTEMI) myocardial infarction: Secondary | ICD-10-CM

## 2015-10-01 DIAGNOSIS — Z7901 Long term (current) use of anticoagulants: Secondary | ICD-10-CM | POA: Diagnosis not present

## 2015-10-01 DIAGNOSIS — I471 Supraventricular tachycardia: Secondary | ICD-10-CM | POA: Diagnosis present

## 2015-10-01 LAB — CBC
HCT: 47.7 % (ref 39.0–52.0)
Hemoglobin: 15.7 g/dL (ref 13.0–17.0)
MCH: 27.5 pg (ref 26.0–34.0)
MCHC: 32.9 g/dL (ref 30.0–36.0)
MCV: 83.5 fL (ref 78.0–100.0)
PLATELETS: 261 10*3/uL (ref 150–400)
RBC: 5.71 MIL/uL (ref 4.22–5.81)
RDW: 14.4 % (ref 11.5–15.5)
WBC: 18.4 10*3/uL — AB (ref 4.0–10.5)

## 2015-10-01 LAB — BASIC METABOLIC PANEL
Anion gap: 8 (ref 5–15)
BUN: 16 mg/dL (ref 6–20)
CALCIUM: 9.5 mg/dL (ref 8.9–10.3)
CO2: 26 mmol/L (ref 22–32)
CREATININE: 1.76 mg/dL — AB (ref 0.61–1.24)
Chloride: 102 mmol/L (ref 101–111)
GFR calc non Af Amer: 45 mL/min — ABNORMAL LOW (ref >60)
GFR, EST AFRICAN AMERICAN: 53 mL/min — AB (ref >60)
Glucose, Bld: 145 mg/dL — ABNORMAL HIGH (ref 65–99)
Potassium: 4.4 mmol/L (ref 3.5–5.1)
SODIUM: 136 mmol/L (ref 135–145)

## 2015-10-01 LAB — I-STAT TROPONIN, ED: TROPONIN I, POC: 0.13 ng/mL — AB (ref 0.00–0.08)

## 2015-10-01 MED ORDER — SODIUM CHLORIDE 0.9 % IV BOLUS (SEPSIS)
1000.0000 mL | Freq: Once | INTRAVENOUS | Status: AC
Start: 2015-10-01 — End: 2015-10-02
  Administered 2015-10-01: 1000 mL via INTRAVENOUS

## 2015-10-01 MED ORDER — ADENOSINE 6 MG/2ML IV SOLN
INTRAVENOUS | Status: AC
  Filled 2015-10-01: qty 6

## 2015-10-01 MED ORDER — ADENOSINE 6 MG/2ML IV SOLN
12.0000 mg | Freq: Once | INTRAVENOUS | Status: AC
  Administered 2015-10-01: 12 mg via INTRAVENOUS

## 2015-10-01 MED ORDER — ADENOSINE 6 MG/2ML IV SOLN
6.0000 mg | Freq: Once | INTRAVENOUS | Status: AC
  Administered 2015-10-01: 6 mg via INTRAVENOUS

## 2015-10-01 MED ORDER — SODIUM CHLORIDE 0.9 % IV BOLUS (SEPSIS)
1000.0000 mL | Freq: Once | INTRAVENOUS | Status: AC
  Administered 2015-10-01: 1000 mL via INTRAVENOUS

## 2015-10-01 NOTE — Telephone Encounter (Signed)
Per record, patient went into afib today. Patient's wife called after hour service to direction regarding dosage. She says Dr. Graciela HusbandsKlein gave her samples of Eliquis, but did not tell her dosage. I do not see a documentation of eliquis, but she is very sure it is eliquis (apixaban).  Since patient's renal function is good. Age only 44yo. Weight >200 lbs. He qualify for normal 5mg  BID dosing. I have instructed patient's wife to take 5mg  BID until she can contact the office in the morning.   Ramond DialSigned, Flemon Kelty PA Pager: [phone]

## 2015-10-01 NOTE — ED Provider Notes (Signed)
CSN: [id number]     Arrival date & time 10/01/15  2240 History  By signing my name below, I, Phillis Haggis, attest that this documentation has been prepared under the direction and in the presence of Loren Racer, MD. Electronically Signed: Phillis Haggis, ED Scribe. 10/01/2015. 12:08 AM.    Chief Complaint  Patient presents with  . Chest Pain   The history is provided by the patient. No language interpreter was used.  HPI Comments: Dejour Vos is a 44 y.o. Male with a hx of AFib who presents to the Emergency Department complaining of substernal, pressured, "bronchial" chest pain onset 0900 this morning. Pt states that he was at the store when he began to feel his heart beating in his chest and experiencing pain. He reports associated generalized weakness and lightheadedness that has persisted throughout the day. Pt reports that he has taken Flecainide for his symptoms tonight to no relief. Per triage note, pt has a HR of 195. Pt has recently been prescribed Eliquis 5 mg by Dr. Graciela Husbands. Wife states he took his first dose of Eliquis today at 7 PM. He denies leg swelling, rectal bleeding, hematochezia, or leg pain. He denies a hx of MI.   Past Medical History  Diagnosis Date  . History of syncope   . Asymptomatic varicose veins   . Atrial fibrillation (HCC)   . Rectal bleed     Colonoscopy age 27 hemorrhoids Buccini   Past Surgical History  Procedure Laterality Date  . Knee arthroscopy    . Varicolectomy  1992   Family History  Problem Relation Age of Onset  . Hypertension    . Hyperlipidemia    . Hearing loss    . Other      arrythmia   Social History  Substance Use Topics  . Smoking status: Never Smoker   . Smokeless tobacco: None  . Alcohol Use: No    Review of Systems  Constitutional: Positive for fatigue. Negative for fever and chills.  Respiratory: Negative for shortness of breath.   Cardiovascular: Positive for chest pain and palpitations. Negative for leg  swelling.  Gastrointestinal: Negative for nausea, vomiting and abdominal pain.  Musculoskeletal: Negative for back pain, neck pain and neck stiffness.  Skin: Negative for rash and wound.  Neurological: Positive for dizziness, weakness and light-headedness. Negative for headaches.  All other systems reviewed and are negative.  Allergies  Review of patient's allergies indicates no known allergies.  Home Medications   Prior to Admission medications   Medication Sig Start Date End Date Taking? Authorizing Provider  apixaban (ELIQUIS) 5 MG TABS tablet Take 5 mg by mouth once.   Yes Historical Provider, MD  flecainide (TAMBOCOR) 100 MG tablet Take 1 tablet (100 mg total) by mouth daily as needed. Patient taking differently: Take 100 mg by mouth daily as needed (AFIB).  02/26/15 02/26/16 Yes Deloris Ping Nahser, MD   BP 114/76 mmHg  Pulse 89  Temp(Src) 97.6 F (36.4 C) (Oral)  Resp 20  Ht  (2.007 m)  Wt 269 lb 11.2 oz (122.335 kg)  BMI 30.37 kg/m2  SpO2 97% Physical Exam  Constitutional: He is oriented to person, place, and time. He appears well-developed and well-nourished. No distress.  Pale appearing  HENT:  Head: Normocephalic and atraumatic.  Mouth/Throat: Oropharynx is clear and moist. No oropharyngeal exudate.  Eyes: EOM are normal. Pupils are equal, round, and reactive to light.  Neck: Normal range of motion. Neck supple.  Cardiovascular: Regular rhythm.  Exam reveals no gallop and no friction rub.   No murmur heard. Tachycardia  Pulmonary/Chest: Effort normal and breath sounds normal. No respiratory distress. He has no wheezes. He has no rales. He exhibits no tenderness.  Abdominal: Soft. Bowel sounds are normal. He exhibits no distension and no mass. There is no tenderness. There is no rebound and no guarding.  Musculoskeletal: Normal range of motion. He exhibits no edema or tenderness.  No lower extremity swelling or pain. Distal pulses intact.  Neurological: He is alert  and oriented to person, place, and time.  Moves all extremities without deficit. Sensation fully intact.  Skin: Skin is warm and dry. No rash noted. No erythema. There is pallor.  Psychiatric: He has a normal mood and affect. His behavior is normal.  Nursing note and vitals reviewed.   ED Course  .Cardioversion Date/Time: 10/01/2015 11:25 PM Performed by: Loren Racer Authorized by: Loren Racer Consent: Verbal consent obtained. Patient identity confirmed: verbally with patient Cardioversion basis: emergent Pre-procedure rhythm: supraventricular tachycardia Patient position: patient was placed in a supine position Post-procedure rhythm: supraventricular tachycardia Complications: no complications Patient tolerance: Patient tolerated the procedure well with no immediate complications Comments: 6 mg of adenosine given with no response. 12 mg of adenosine also given without response.   (including critical care time) DIAGNOSTIC STUDIES: Oxygen Saturation is 97% on RA, normal by my interpretation.    COORDINATION OF CARE: 11:09 PM-Discussed treatment plan which includes labs, Adenosine and cardioversion with pt at bedside and pt agreed to plan.   CRITICAL CARE Performed by: Loren Racer, MD Total critical care time: 45 minutes Critical care time was exclusive of separately billable procedures and treating other patients. Critical care was necessary to treat or prevent imminent or life-threatening deterioration. Critical care was time spent personally by me on the following activities: development of treatment plan with patient and/or surrogate as well as nursing, discussions with consultants, evaluation of patient's response to treatment, examination of patient, obtaining history from patient or surrogate, ordering and performing treatments and interventions, ordering and review of laboratory studies, ordering and review of radiographic studies, pulse oximetry and  re-evaluation of patient's condition.   Labs Review Labs Reviewed  BASIC METABOLIC PANEL - Abnormal; Notable for the following:    Glucose, Bld 145 (*)    Creatinine, Ser 1.76 (*)    GFR calc non Af Amer 45 (*)    GFR calc Af Amer 53 (*)    All other components within normal limits  CBC - Abnormal; Notable for the following:    WBC 18.4 (*)    All other components within normal limits  TROPONIN I - Abnormal; Notable for the following:    Troponin I 0.34 (*)    All other components within normal limits  DIFFERENTIAL - Abnormal; Notable for the following:    Neutro Abs 15.1 (*)    Monocytes Absolute 1.4 (*)    All other components within normal limits  I-STAT TROPOININ, ED - Abnormal; Notable for the following:    Troponin i, poc 0.13 (*)    All other components within normal limits  CULTURE, BLOOD (ROUTINE X 2)  CULTURE, BLOOD (ROUTINE X 2)  TROPONIN I  TROPONIN I  BASIC METABOLIC PANEL  CBC  TSH  LIPID PANEL    Imaging Review Dg Chest 2 View  10/02/2015  CLINICAL DATA:  Shortness of breath, palpitations, and chest pain. EXAM: CHEST  2 VIEW COMPARISON:  None. FINDINGS: The heart size and mediastinal contours  are within normal limits. Both lungs are clear. The visualized skeletal structures are unremarkable. IMPRESSION: No active cardiopulmonary disease. Electronically Signed   By: William  Stevens M.D.   On: 10/02/2015 0Burman Nieves1:15   I have personally reviewed and evaluated these images and lab results as part of my medical decision-making.   EKG Interpretation   Date/Time:  Tuesday October 01 2015 23:29:45 EST Ventricular Rate:  103 PR Interval:  187 QRS Duration: 104 QT Interval:  311 QTC Calculation: 407 R Axis:   -80 Text Interpretation:  Sinus or ectopic atrial tachycardia Left axis  deviation Probable anterior infarct, age indeterminate Repol abnrm, severe  global ischemia (LM/MVD) Confirmed by Ranae PalmsYELVERTON  MD, Daryn Pisani ([id number]) on  10/01/2015 11:43:45 PM      MDM    Final diagnoses:  Chest pain    I personally performed the services described in this documentation, which was scribed in my presence. The recorded information has been reviewed and is accurate.    Patient with what appeared to be SVT on initial EKG. Routine Valsalva maneuvers with no improvement of symptoms. Patient given adenosine 6 mg without change in rhythm. Patient then was given 12 mg of adenosine without noticeable change. Modified Valsalva maneuver attempted with improvement of heart rate into the low 100s. Patient states that chest pressure resolved. Blood pressure improved. Discuss with cardiology on-call. We'll see patient in the emergency department and admit to telemetry bed.  Loren Raceravid Sherley Leser, MD 10/02/15 [phone]

## 2015-10-01 NOTE — Telephone Encounter (Signed)
New Message    Pt's wife calling stating that pt has been in A-fib for the last 3 hrs and they wants to know what they should do. They plan on traveling tomorrow and are concerned. Please call back and advise.

## 2015-10-01 NOTE — ED Notes (Signed)
Vagal manuvers performed with Ranae PalmsYelverton MD at bedside; pt now in ST with HR 105; patient to continue lying supine; will continue to monitor

## 2015-10-01 NOTE — ED Notes (Addendum)
Pt states he was walking through a store about 1 hour ago and started to feel his heart beating in his chest and started having substernal chest pressure. pts HR in 190s. Pt states he feels lightheaded and very weak.

## 2015-10-01 NOTE — ED Notes (Addendum)
Gregory PalmsYelverton, MD at the bedside; vagal maneuvers performed.

## 2015-10-01 NOTE — Telephone Encounter (Signed)
Patient's wife called again says her husband's SBP was in 80s and HR was 200s, and he does not feel good. I told her in that case, I would not wait until tomorrow, I recommended the patient to come to the ED.  Ramond DialSigned, Thomasa Heidler PA Pager: [phone]

## 2015-10-02 ENCOUNTER — Encounter (HOSPITAL_COMMUNITY)
Admission: EM | Disposition: A | Discharge status: Home / Self Care | Payer: PRIVATE HEALTH INSURANCE | Source: Home / Self Care | Attending: Emergency Medicine

## 2015-10-02 ENCOUNTER — Encounter (HOSPITAL_COMMUNITY): Payer: Self-pay | Admitting: Cardiovascular Disease

## 2015-10-02 ENCOUNTER — Observation Stay (HOSPITAL_COMMUNITY): Payer: PRIVATE HEALTH INSURANCE

## 2015-10-02 DIAGNOSIS — I209 Angina pectoris, unspecified: Secondary | ICD-10-CM | POA: Diagnosis not present

## 2015-10-02 DIAGNOSIS — R7989 Other specified abnormal findings of blood chemistry: Secondary | ICD-10-CM

## 2015-10-02 DIAGNOSIS — R000 Tachycardia, unspecified: Secondary | ICD-10-CM | POA: Diagnosis not present

## 2015-10-02 DIAGNOSIS — I471 Supraventricular tachycardia: Secondary | ICD-10-CM | POA: Diagnosis present

## 2015-10-02 DIAGNOSIS — I214 Non-ST elevation (NSTEMI) myocardial infarction: Secondary | ICD-10-CM

## 2015-10-02 DIAGNOSIS — R079 Chest pain, unspecified: Secondary | ICD-10-CM | POA: Insufficient documentation

## 2015-10-02 HISTORY — PX: CARDIAC CATHETERIZATION: SHX172

## 2015-10-02 LAB — CBC
HEMATOCRIT: 41.5 % (ref 39.0–52.0)
HEMOGLOBIN: 13.9 g/dL (ref 13.0–17.0)
MCH: 27.5 pg (ref 26.0–34.0)
MCHC: 33.5 g/dL (ref 30.0–36.0)
MCV: 82 fL (ref 78.0–100.0)
Platelets: 219 10*3/uL (ref 150–400)
RBC: 5.06 MIL/uL (ref 4.22–5.81)
RDW: 14.4 % (ref 11.5–15.5)
WBC: 13.9 10*3/uL — ABNORMAL HIGH (ref 4.0–10.5)

## 2015-10-02 LAB — BASIC METABOLIC PANEL
ANION GAP: 7 (ref 5–15)
BUN: 12 mg/dL (ref 6–20)
CHLORIDE: 107 mmol/L (ref 101–111)
CO2: 24 mmol/L (ref 22–32)
Calcium: 8.6 mg/dL — ABNORMAL LOW (ref 8.9–10.3)
Creatinine, Ser: 1.05 mg/dL (ref 0.61–1.24)
GFR calc Af Amer: >60 mL/min (ref >60)
GFR calc non Af Amer: >60 mL/min (ref >60)
GLUCOSE: 111 mg/dL — AB (ref 65–99)
POTASSIUM: 3.8 mmol/L (ref 3.5–5.1)
Sodium: 138 mmol/L (ref 135–145)

## 2015-10-02 LAB — DIFFERENTIAL
BASOS ABS: 0 10*3/uL (ref 0.0–0.1)
Basophils Relative: 0 %
Eosinophils Absolute: 0 10*3/uL (ref 0.0–0.7)
Eosinophils Relative: 0 %
LYMPHS ABS: 2.3 10*3/uL (ref 0.7–4.0)
Lymphocytes Relative: 12 %
MONO ABS: 1.4 10*3/uL — AB (ref 0.1–1.0)
MONOS PCT: 8 %
NEUTROS ABS: 15.1 10*3/uL — AB (ref 1.7–7.7)
Neutrophils Relative %: 80 %

## 2015-10-02 LAB — TSH: TSH: 1.161 u[IU]/mL (ref 0.350–4.500)

## 2015-10-02 LAB — LIPID PANEL
CHOL/HDL RATIO: 6 ratio
CHOLESTEROL: 173 mg/dL (ref 0–200)
HDL: 29 mg/dL — ABNORMAL LOW (ref >40)
LDL CALC: 125 mg/dL — AB (ref 0–99)
Triglycerides: 95 mg/dL (ref <150)
VLDL: 19 mg/dL (ref 0–40)

## 2015-10-02 LAB — TROPONIN I
TROPONIN I: 0.34 ng/mL — AB (ref <0.031)
Troponin I: 0.77 ng/mL (ref <0.031)
Troponin I: 0.92 ng/mL (ref <0.031)

## 2015-10-02 LAB — PROTIME-INR
INR: 1.16 (ref 0.00–1.49)
PROTHROMBIN TIME: 14.9 s (ref 11.6–15.2)

## 2015-10-02 SURGERY — LEFT HEART CATH AND CORONARY ANGIOGRAPHY

## 2015-10-02 MED ORDER — ONDANSETRON HCL 4 MG/2ML IJ SOLN: 4.0000 mg | Freq: Four times a day (QID) | INTRAMUSCULAR | Status: DC | PRN

## 2015-10-02 MED ORDER — APIXABAN 5 MG PO TABS: 5.0000 mg | ORAL_TABLET | Freq: Two times a day (BID) | ORAL | Status: DC

## 2015-10-02 MED ORDER — MIDAZOLAM HCL 2 MG/2ML IJ SOLN
INTRAMUSCULAR | Status: DC | PRN
  Administered 2015-10-02: 1 mg via INTRAVENOUS

## 2015-10-02 MED ORDER — METOPROLOL TARTRATE 25 MG PO TABS: 12.5000 mg | ORAL_TABLET | Freq: Four times a day (QID) | ORAL | Status: DC | PRN

## 2015-10-02 MED ORDER — IOHEXOL 350 MG/ML SOLN
INTRAVENOUS | Status: DC | PRN
  Administered 2015-10-02: 70 mL via INTRA_ARTERIAL

## 2015-10-02 MED ORDER — SODIUM CHLORIDE 0.9 % WEIGHT BASED INFUSION: 1.0000 mL/kg/h | INTRAVENOUS | Status: AC

## 2015-10-02 MED ORDER — AZITHROMYCIN 500 MG PO TABS
500.0000 mg | ORAL_TABLET | ORAL | Status: DC
  Administered 2015-10-02: 500 mg via ORAL
  Filled 2015-10-02: qty 1
  Filled 2015-10-02: qty 2

## 2015-10-02 MED ORDER — SODIUM CHLORIDE 0.9 % WEIGHT BASED INFUSION: 1.0000 mL/kg/h | INTRAVENOUS | Status: DC

## 2015-10-02 MED ORDER — SODIUM CHLORIDE 0.9 % IJ SOLN: 3.0000 mL | Freq: Two times a day (BID) | INTRAMUSCULAR | Status: DC

## 2015-10-02 MED ORDER — HEPARIN SODIUM (PORCINE) 1000 UNIT/ML IJ SOLN
INTRAMUSCULAR | Status: DC | PRN
  Administered 2015-10-02: 6000 [IU] via INTRAVENOUS

## 2015-10-02 MED ORDER — FENTANYL CITRATE (PF) 100 MCG/2ML IJ SOLN
INTRAMUSCULAR | Status: DC | PRN
  Administered 2015-10-02: 25 ug via INTRAVENOUS

## 2015-10-02 MED ORDER — ASPIRIN 325 MG PO TABS
325.0000 mg | ORAL_TABLET | Freq: Once | ORAL | Status: AC
  Administered 2015-10-02: 325 mg via ORAL
  Filled 2015-10-02: qty 1

## 2015-10-02 MED ORDER — METOPROLOL TARTRATE 12.5 MG HALF TABLET
12.5000 mg | ORAL_TABLET | Freq: Two times a day (BID) | ORAL | Status: DC
  Administered 2015-10-02: 12.5 mg via ORAL
  Filled 2015-10-02: qty 1

## 2015-10-02 MED ORDER — SODIUM CHLORIDE 0.9 % IV SOLN: 250.0000 mL | INTRAVENOUS | Status: DC | PRN

## 2015-10-02 MED ORDER — SODIUM CHLORIDE 0.9 % IJ SOLN: 3.0000 mL | INTRAMUSCULAR | Status: DC | PRN

## 2015-10-02 MED ORDER — METOPROLOL TARTRATE 12.5 MG HALF TABLET: 12.5000 mg | ORAL_TABLET | Freq: Two times a day (BID) | ORAL | Status: DC

## 2015-10-02 MED ORDER — VERAPAMIL HCL 2.5 MG/ML IV SOLN
INTRAVENOUS | Status: AC
  Filled 2015-10-02: qty 2

## 2015-10-02 MED ORDER — HEPARIN SODIUM (PORCINE) 1000 UNIT/ML IJ SOLN
INTRAMUSCULAR | Status: AC
  Filled 2015-10-02: qty 1

## 2015-10-02 MED ORDER — SODIUM CHLORIDE 0.9 % WEIGHT BASED INFUSION
3.0000 mL/kg/h | INTRAVENOUS | Status: DC
  Administered 2015-10-02: 3 mL/kg/h via INTRAVENOUS

## 2015-10-02 MED ORDER — HEPARIN (PORCINE) IN NACL 2-0.9 UNIT/ML-% IJ SOLN
INTRAMUSCULAR | Status: AC
  Filled 2015-10-02: qty 1000

## 2015-10-02 MED ORDER — ASPIRIN 81 MG PO CHEW
81.0000 mg | CHEWABLE_TABLET | ORAL | Status: AC
  Administered 2015-10-02: 81 mg via ORAL

## 2015-10-02 MED ORDER — ACETAMINOPHEN 325 MG PO TABS: 650.0000 mg | ORAL_TABLET | ORAL | Status: DC | PRN

## 2015-10-02 MED ORDER — MIDAZOLAM HCL 2 MG/2ML IJ SOLN
INTRAMUSCULAR | Status: AC
  Filled 2015-10-02: qty 2

## 2015-10-02 MED ORDER — AZITHROMYCIN 250 MG PO TABS: 250.0000 mg | ORAL_TABLET | Freq: Every day | ORAL | Status: AC

## 2015-10-02 MED ORDER — ASPIRIN EC 81 MG PO TBEC
81.0000 mg | DELAYED_RELEASE_TABLET | Freq: Every day | ORAL | Status: DC
  Administered 2015-10-02: 81 mg via ORAL
  Filled 2015-10-02: qty 1

## 2015-10-02 MED ORDER — LIDOCAINE HCL (PF) 1 % IJ SOLN
INTRAMUSCULAR | Status: DC | PRN
  Administered 2015-10-02: 2 mL

## 2015-10-02 MED ORDER — FENTANYL CITRATE (PF) 100 MCG/2ML IJ SOLN
INTRAMUSCULAR | Status: AC
  Filled 2015-10-02: qty 2

## 2015-10-02 MED ORDER — VERAPAMIL HCL 2.5 MG/ML IV SOLN
INTRAVENOUS | Status: DC | PRN
  Administered 2015-10-02: 10:00:00 via INTRA_ARTERIAL

## 2015-10-02 MED ORDER — LIDOCAINE HCL (PF) 1 % IJ SOLN
INTRAMUSCULAR | Status: AC
  Filled 2015-10-02: qty 30

## 2015-10-02 MED ORDER — CEFTRIAXONE SODIUM 1 G IJ SOLR
1.0000 g | INTRAMUSCULAR | Status: DC
  Administered 2015-10-02: 1 g via INTRAMUSCULAR
  Filled 2015-10-02 (×2): qty 10

## 2015-10-02 MED ORDER — LIDOCAINE HCL (PF) 1 % IJ SOLN
5.0000 mL | Freq: Once | INTRAMUSCULAR | Status: AC
  Administered 2015-10-02: 5 mL
  Filled 2015-10-02: qty 5

## 2015-10-02 SURGICAL SUPPLY — 10 items
CATH INFINITI 5FR ANG PIGTAIL (CATHETERS) ×3 IMPLANT
CATH OPTITORQUE JACKY 4.0 5F (CATHETERS) ×3 IMPLANT
DEVICE RAD COMP TR BAND LRG (VASCULAR PRODUCTS) ×3 IMPLANT
GLIDESHEATH SLEND SS 6F .021 (SHEATH) ×3 IMPLANT
KIT HEART LEFT (KITS) ×3 IMPLANT
PACK CARDIAC CATHETERIZATION (CUSTOM PROCEDURE TRAY) ×3 IMPLANT
SYR MEDRAD MARK V 150ML (SYRINGE) ×3 IMPLANT
TRANSDUCER W/STOPCOCK (MISCELLANEOUS) ×3 IMPLANT
TUBING CIL FLEX 10 FLL-RA (TUBING) ×3 IMPLANT
WIRE SAFE-T 1.5MM-J .035X260CM (WIRE) ×3 IMPLANT

## 2015-10-02 NOTE — ED Notes (Signed)
Pt returned from xray

## 2015-10-02 NOTE — Progress Notes (Signed)
PATIENT ARRIVED TO UNIT 2W FROM E.D. VIA STRETCHER. ASSISTED TO BED. VITALS OBTAINED. TELE APPLIED. ASSESSMENT PERFORMED.  PATIENT ORIENTED TO EQUIPMENT. INSTRUCTED TO CALL FOR ASSISTANCE WHEN NEEDED.  WIFE AT BEDSIDE.

## 2015-10-02 NOTE — Progress Notes (Signed)
TR band removed @ 1 pm.  No bleeding noted.  Pt denies any pain or numbness to right hand.  Call rcvd from [facility] that Pt had converted to NSR after catheter procedure.  PA notified of conversion to NSR.  Call placed to cath lab to see if any protocol preventing Pt from discharge.  Per Dorothyann GibbsNeely no standing protocol on time between test and discharge, each case based on individual severity and physician judgment.  Await further discharge instruction.

## 2015-10-02 NOTE — ED Notes (Signed)
Pt placed back on cardiac monitor. HR 90-100s

## 2015-10-02 NOTE — Progress Notes (Signed)
Called pt has reverted to sinus  Ok for discharge  Will use metoprolol prn No anticoagulation  No more flecainide

## 2015-10-02 NOTE — Interval H&P Note (Signed)
History and Physical Interval Note:  10/02/2015 10:14 AM  Gregory Peck  has presented today for surgery, with the diagnosis of cp  The various methods of treatment have been discussed with the patient and family. After consideration of risks, benefits and other options for treatment, the patient has consented to  Procedure(s): Left Heart Cath and Coronary Angiography (N/A) as a surgical intervention .  The patient's history has been reviewed, patient examined, no change in status, stable for surgery.  I have reviewed the patient's chart and labs.  Questions were answered to the patient's satisfaction.     Lorine BearsMuhammad Goro Wenrick

## 2015-10-02 NOTE — Progress Notes (Signed)
Recheck of right hand:  Oxygen sats 95%  BP 94/60  Pulse 71.

## 2015-10-02 NOTE — Telephone Encounter (Signed)
Late entry- Dr. Graciela HusbandsKlein called and spoke with the patient last night.  He had already taken a total of flecainide 400 mg. Per Dr. Graciela HusbandsKlein, the patient was going to pick up some samples of Pradaxa from him last night. If still in a-fib this morning, he is to call the office and we are to set him up for DCCV today.

## 2015-10-02 NOTE — Discharge Instructions (Signed)
Please call The atrial Fibrillation clinic to make an appointment for 3-4 weeks for follow-up visit. [phone]    PLEASE REMEMBER TO BRING ALL OF YOUR MEDICATIONS TO EACH OF YOUR FOLLOW-UP OFFICE VISITS.  PLEASE ATTEND ALL SCHEDULED FOLLOW-UP APPOINTMENTS.   Activity: Increase activity slowly as tolerated. You may shower, but no soaking baths (or swimming) for 1 week. No driving for 2 days. No lifting over 5 lbs for 1 week. No sexual activity for 1 week.   You May Return to Work: in 1 week (if applicable)  Wound Care: You may wash cath site gently with soap and water. Keep cath site clean and dry. If you notice pain, swelling, bleeding or pus at your cath site, please call [phone].    Cardiac Cath Site Care Refer to this sheet in the next few weeks. These instructions provide you with information on caring for yourself after your procedure. Your caregiver may also give you more specific instructions. Your treatment has been planned according to current medical practices, but problems sometimes occur. Call your caregiver if you have any problems or questions after your procedure. HOME CARE INSTRUCTIONS  You may shower 24 hours after the procedure. Remove the bandage (dressing) and gently wash the site with plain soap and water. Gently pat the site dry.   Do not apply powder or lotion to the site.   Do not sit in a bathtub, swimming pool, or whirlpool for 5 to 7 days.   No bending, squatting, or lifting anything over 10 pounds (4.5 kg) as directed by your caregiver.   Inspect the site at least twice daily.   Do not drive home if you are discharged the same day of the procedure. Have someone else drive you.   You may drive 24 hours after the procedure unless otherwise instructed by your caregiver.  What to expect:  Any bruising will usually fade within 1 to 2 weeks.   Blood that collects in the tissue (hematoma) may be painful to the touch. It should usually decrease in  size and tenderness within 1 to 2 weeks.  SEEK IMMEDIATE MEDICAL CARE IF:  You have unusual pain at the site or down the affected limb.   You have redness, warmth, swelling, or pain at the site.   You have drainage (other than a small amount of blood on the dressing).   You have chills.   You have a fever or persistent symptoms for more than 72 hours.   You have a fever and your symptoms suddenly get worse.   Your leg becomes pale, cool, tingly, or numb.   You have heavy bleeding from the site. Hold pressure on the site.  Document Released: 11/28/2010 Document Revised: 10/15/2011 Document Reviewed: 11/28/2010 [facility] Patient Information 2012 [employer], [place].

## 2015-10-02 NOTE — ED Notes (Signed)
Phlebotomy at bedside.

## 2015-10-02 NOTE — Telephone Encounter (Signed)
Per Gypsy BalsamAmber Seiler, NP- patient was admitted last night.

## 2015-10-02 NOTE — Discharge Summary (Signed)
DISCHARGE SUMMARY    Patient ID: Gregory Peck,  MRN: [id number], DOB/AGE: [birth year] 44 y.o.  Admit date: 10/01/2015 Discharge date: 10/02/2015  Primary Care Physician: Lorretta Harp, MD  Electrophysiologist: Dr. Graciela Husbands  Primary Discharge Diagnosis:  Atrial Flutter with RVR CP  Secondary Discharge Diagnosis:  1. PAF, CHADs2 VASC = 0  Procedures This Admission:  1. LHC    Conclusion     The left ventricular systolic function is normal.  1. Normal coronary arteries. 2. Normal LV systolic function. Normal left ventricular end-diastolic pressure.     Brief HPI: Gregory Peck is a 44 y.o. male with a history of PAFib developed racing palpitations, eventually seeking medical attention at [facility] with c/p CP as well, he was noted to have Rapid AFlutter with EKG changes concerning for ischemia and underwent cardiac cath disclosed no CAD, he self converted to SR post procedure.  Hospital Course:  The patient was admitted 10/01/15 with c/o CP, palpitations.  He was noted to have rapid AFlutter, mildly abnormal troponins, and ischemic looking EKG changes.  He reported that he developed palpitations and lightheadedness, he went down to one knee but did not lose consciousness. He was able to return to his truck and ultimately went home where his symptoms continued, then developed chest pain and shortness of breath. He took a dose of flecainide, which he had previously been prescribed as a "pill in the pocket" by Dr. Graciela Husbands. However, he experienced no relief in his symptoms. and ultimately came in.  He  was given several doses of adenosine without improvement in his tachycardia. He subsequently performed several vagal maneuvers and had a decline in his ventricular rate to approximately 110 bpm. With this decrease in ventricular rate, his chest pain and lightheadedness had improved and ultimately resolved.  He underwent cardiac cath today noting normal coronaries.  He self  converted back to SR this afternoon.  The patient is being taken off Flecainide, and will use Metoprolol along with his Eliquis with a "pill in the pocket" approach, he has Eliquis at home and knows how to take it as well.  His TR band was removed at 1:00PM, and cath site is stable.  The patient was noted to have leukocytosis and suspect URI, with a negative CXR and started on antibiotics, we will continue the zithromax  daily for 4 more days. The patient has been seen and evaluated by Dr. Graciela Husbands and felt stable for discharge.  Unable to make him an AF clinic f/u at this time, he is given instructions to call after the holiday to make an appointment, we request he be called by the clinic as well to make the appointment.  Physical Exam: Filed Vitals:   10/02/15 1031 10/02/15 1035 10/02/15 1040 10/02/15 1102  BP: 107/73 109/77 103/73 108/65  Pulse: 81 75 60 75  Temp:    97.6 F (36.4 C)  TempSrc:    Oral  Resp: 68  Height:      Weight:      SpO2: 95% 97% 97% 100%     Labs:   Lab Results  Component Value Date   WBC 13.9* 10/02/2015   HGB 13.9 10/02/2015   HCT 41.5 10/02/2015   MCV 82.0 10/02/2015   PLT 219 10/02/2015     Recent Labs Lab 10/02/15 0620  NA 138  K 3.8  CL 107  CO2 24  BUN 12  CREATININE 1.05  CALCIUM 8.6*  GLUCOSE 111*  Discharge Medications:    Medication List    STOP taking these medications        flecainide 100 MG tablet  Commonly known as:  TAMBOCOR      TAKE these medications        azithromycin 250 MG tablet  Commonly known as:  ZITHROMAX  Take 1 tablet (250 mg total) by mouth daily.  Start taking on:  10/03/2015     ELIQUIS 5 MG Tabs tablet  Generic drug:  apixaban  Take 5 mg by mouth once.     metoprolol tartrate 25 MG tablet  Commonly known as:  LOPRESSOR  Take 0.5 tablets (12.5 mg total) by mouth every 6 (six) hours as needed (palpitations).        Disposition: Home    Duration of Discharge Encounter:  Greater than 30 minutes including physician time.  Norma FredricksonSigned, Fallou Hulbert, PA-C 10/02/2015 3:57 PM

## 2015-10-02 NOTE — Progress Notes (Signed)
       Patient Name: Gregory Peck      SUBJECTIVE:sleepy but feeling much better  Past Medical History  Diagnosis Date  . History of syncope   . Asymptomatic varicose veins   . Atrial fibrillation (HCC)   . Rectal bleed     Colonoscopy age 44 hemorrhoids Buccini    Scheduled Meds:  Scheduled Meds: . apixaban  5 mg Oral BID  . aspirin EC  81 mg Oral Daily  . azithromycin  500 mg Oral Q24H  . cefTRIAXone  1 g Intramuscular Q24H  . metoprolol tartrate  12.5 mg Oral BID   Continuous Infusions:  acetaminophen, ondansetron (ZOFRAN) IV    PHYSICAL EXAM Filed Vitals:   10/02/15 0030 10/02/15 0045 10/02/15 0209 10/02/15 0626  BP: 127/84 124/86 114/76 109/73  Pulse: 113 103 89 96  Temp:   97.6 F (36.4 C) 97.6 F (36.4 C)  TempSrc:   Oral Oral  Resp: 14 31 20 18   Height:   6\' 7"  (2.007 m)   Weight:   269 lb 11.2 oz (122.335 kg)   SpO2: 99% 100% 97% 97%   Well developed and nourished in no acute distress HENT normal Neck supple with JVP-flat Carotids brisk and full without bruits Clear Irregularly irregular rate and rhythm with controlled ventricular response, no murmurs or gallops Abd-soft with active BS without hepatomegaly No Clubbing cyanosis edema Skin-warm and dry A & Oriented  Grossly normal sensory and motor function   TELEMETRY: Reviewed telemetry pt in  afluttter controlled VR  No intake or output data in the 24 hours ending 10/02/15 0827  LABS: Basic Metabolic Panel:  Recent Labs Lab 10/01/15 2302 10/02/15 0620  NA 136 138  K 4.4 3.8  CL 102 107  CO2 26 24  GLUCOSE 145* 111*  BUN 16 12  CREATININE 1.76* 1.05  CALCIUM 9.5 8.6*   Cardiac Enzymes:  Recent Labs  10/02/15 0147 10/02/15 0620  TROPONINI 0.34* 0.77*   CBC:  Recent Labs Lab 10/01/15 2302 10/02/15 0147 10/02/15 0620  WBC 18.4*  --  13.9*  NEUTROABS  --  15.1*  --   HGB 15.7  --  13.9  HCT 47.7  --  41.5  MCV 83.5  --  82.0  PLT 261  --  219    Fasting  Lipid Panel:  Recent Labs  10/02/15 0620  CHOL 173  HDL 29*  LDLCALC 125*  TRIG 95  CHOLHDL 6.0   Thyroid Function Tests:  Recent Labs  10/02/15 0620  TSH 1.161   Anemia Panel:     ASSESSMENT AND PLAN:  Active Problems:   Atrial tachycardia (HCC)   NSTEMI (non-ST elevated myocardial infarction) (HCC)  Pt with recurrent tachypalps and unusual assoc symptoms nausea and orthopnea  He came to hosp after we spoke yesteday PM and + tn and abnormal ECG even at HR 100 is striking and as such think we should proceed with Cath first and tworry about the rhythm secondarily  Cath results>>neg Feeling much better with rate controlled aflutter Will plan outpt cardioversion/TEE on Friday as it is not entirely clear as to onset and recent paper suggests the 24-48 hr may not be as benign as thought  Will continue apixoban and willneed 4 weeks post cardioversion  Insurance issues may preclude early catheter ablation   Signed, Sherryl MangesSteven Jovaun Levene MD  10/02/2015

## 2015-10-02 NOTE — H&P (Signed)
History and Physical  Patient ID: Gregory Peck MRN: [id number], DOB: [birth year] Date of Encounter: 10/02/2015, 1:28 AM Primary Physician: Lorretta Harp, MD Primary Cardiologist: Dr. Graciela Husbands  Chief Complaint: Palpitations and chest pain Reason for Admission: Atrial tachycardia and elevated troponin  HPI: Gregory Peck is a 44 year old man with history of paroxysmal atrial fibrillation followed by Drs. Graciela Husbands and Allred, whom we have been asked to see by the emergency department due to an episode of prolonged narrow complex tachycardia.  The patient states that he was in his usual state of health until approximately 9:00 yesterday morning (10/01/15).  He had just gotten out of his truck and walked into a store when he developed palpitations and lightheadedness.  He went down to one knee but did not lose consciousness.  He was able to return to his truck and ultimately went home where his symptoms continued.  He subsequent developed accompanied chest pain and shortness of breath.  He describes the pain as a tightness and soreness, which seemed worse when taking a deep breath or later driving over bumps on the way to the hospital..  Patient took a dose of flecainide, which she had previously been prescribed as a "pill in the pocket" by Dr. Graciela Husbands.  However, he experienced no relief in his symptoms.  Contacted Dr. Odessa Fleming office and was advised to begin taking apixaban 5 mg twice a day (first dose at 1900 this evening).  He was also scheduled for outpatient cardioversion later today but came to the ER due to a persistent heart rate around 200 bpm with systolic blood pressures in the 80s, as measured by his wife.  In the emergency department, the patient was given several doses of adenosine without improvement in his tachycardia.  He subsequently performed several vagal maneuvers and had a decline in his ventricular rate to approximately 110 bpm.  With this decrease in ventricular rate, his chest pain and  lightheadedness have improved.  He continues to feel a vague soreness in his chest but no longer experiences the aforementioned tightness.  Mr. Gregory Peck notes that he developed upper respiratory tract infection symptoms about 3 weeks ago and has continued to have a persistent cough that is intermittently productive of green sputum.  Earlier today after onset of his palpitations, he also experienced chills.  Past Medical History  Diagnosis Date  . History of syncope   . Asymptomatic varicose veins   . Atrial fibrillation (HCC)   . Rectal bleed     Colonoscopy age 14 hemorrhoids Buccini     Most Recent Cardiac Studies: Transthoracic echocardiogram (04/02/15): Normal LV size with low normal contraction (LVEF 50-55%).  Mild biatrial enlargement .  Upper normal pulmonary artery systolic pressure (35 mmHg).  Normal RV size and function.     Surgical History:  Past Surgical History  Procedure Laterality Date  . Knee arthroscopy    . Varicolectomy  1992     Home Meds: Prior to Admission medications   Medication Sig Start Date Katisha Shimizu Date Taking? Authorizing Provider  apixaban (ELIQUIS) 5 MG TABS tablet Take 5 mg by mouth once.   Yes Historical Provider, MD  flecainide (TAMBOCOR) 100 MG tablet Take 1 tablet (100 mg total) by mouth daily as needed. Patient taking differently: Take 100 mg by mouth daily as needed (AFIB).  02/26/15 02/26/16 Yes Vesta Mixer, MD    Allergies: No Known Allergies  Social History   Social History  . Marital Status: Married    Spouse Name: N/A  .  Number of Children: N/A  . Years of Education: N/A   Occupational History  . Not on file.   Social History Main Topics  . Smoking status: Never Smoker   . Smokeless tobacco: Not on file  . Alcohol Use: No  . Drug Use: Not on file  . Sexual Activity: Not on file   Other Topics Concern  . 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 Hypertension    . Hyperlipidemia    . Hearing loss    . Other      arrythmia    Review of Systems: A 12 system review of systems was notable for intermittent left lower quadrant abdominal pain and tingling along the abdominal wall.  Patient states that he has noticed an intermittent bulge there in the past.  He reports that this was not in the region of the inguinal canal or scrotum but more cephalad.  Otherwise, a 12 system review of systems was performed and was negative except as noted in the history of present illness.  Labs:   Lab Results  Component Value Date   WBC 18.4* 10/01/2015   HGB 15.7 10/01/2015   HCT 47.7 10/01/2015   MCV 83.5 10/01/2015   PLT 261 10/01/2015    Recent Labs Lab 10/01/15 2302  NA 136  K 4.4  CL 102  CO2 26  BUN 16  CREATININE 1.76*  CALCIUM 9.5  GLUCOSE 145*   POC TnI: 0.13  Lab Results  Component Value Date   CHOL 201* 07/01/2011   HDL 39.00* 07/01/2011   LDLCALC 121* 04/26/2008   TRIG 118.0 07/01/2011   No results found for: DDIMER  Radiology/Studies:  Dg Chest 2 View  10/02/2015  CLINICAL DATA:  Shortness of breath, palpitations, and chest pain. EXAM: CHEST  2 VIEW COMPARISON:  None. FINDINGS: The heart size and mediastinal contours are within normal limits. Both lungs are clear. The visualized skeletal structures are unremarkable. IMPRESSION: No active cardiopulmonary disease. Electronically Signed   By: Burman NievesWilliam  Stevens M.D.   On: 10/02/2015 01:15   Wt Readings from Last 3 Encounters:  10/01/15 122.471 kg (270 lb)  04/10/15 121.473 kg (267 lb 12.8 oz)  04/09/15 121.201 kg (267 lb 3.2 oz)    EKG:  10/01/15 at 2329: Atrial flutter versus atrial tachycardia with variable conduction, left axis deviation, and 1-2 mm inferolateral ST segment depressions and  T-wave inversions.  Poor R-wave progression.  10/01/15 at 2247: Regular narrow complex tachycardia most likely representing SVT .  Left axis deviation with marked inferolateral ST segment depressions and poor R-wave progression.     Physical Exam: Blood pressure 124/86, pulse 103, temperature 97.8 F (36.6 C), temperature source Oral, resp. rate 31, height 6\' 7"  (2.007 m), weight 122.471 kg (270 lb), SpO2 100 %. General: Well developed, well nourished, in no acute distress. He is lying comfortably in bed with his wife at the bedside. Head: Normocephalic, atraumatic, sclera non-icteric, no xanthomas, nares are without discharge.  Neck: Negative for carotid bruits. JVD not elevated. Lungs: Clear bilaterally to auscultation without wheezes, rales, or rhonchi. Breathing  is unlabored. Heart: Tachycardic but regular without murmurs, rubs, or gallops. Abdomen: Soft, non-tender, non-distended with normoactive bowel sounds. No hepatomegaly. No rebound/guarding. No obvious abdominal masses or hernias. Msk:  Strength and tone appear normal for age. Extremities: No clubbing or cyanosis. No edema.  Distal pedal pulses are 2+ and equal bilaterally. Neuro: Alert and oriented X 3. No focal deficit. No facial asymmetry. Moves all extremities spontaneously. Psych:  Responds to questions appropriately with a normal affect.    ASSESSMENT AND PLAN:  44 year old man with history of paroxysmal atrial fibrillation presenting with prolonged episode of palpitations and accompanied chest pain, found to be in rapid narrow complex tachycardia that resolved following multiple doses of adenosine and vagal maneuvers.  Narrow complex tachycardia: Review the patient's EKG suggested is most likely reflects atrial tachycardia with variable conduction.  Atrial flutter is also a consideration but is less likely based on the appearance of the electrocardiogram.  At this time, the patient's heart rate is much better controlled,  albeit above the expected resting heart rate for him.  I think it is reasonable to proceed with a rate control strategy, which may ultimately also facilitate termination of this arrhythmia.  I will withhold additional doses of flecainide.  In case this does represent atypical flutter, we will continue with systemic anticoagulation with Eliquis, given that the patient had taken his first dose earlier in the evening. - Admit to telemetry, observation status. - Initiate metoprolol tartrate 12.5 mg twice a day, which can be increased as blood pressure tolerates. - Continue apixaban 5 mg twice a day. - Check TSH and treat other potential causes of palpitations with leukocytosis. - Electrophysiology to evaluate the patient in the morning for other long-term treatment options, including EP study and ablation versus antiarrhythmic therapy.  Troponin elevation: I suspect this likely reflects supply-demand mismatch in the setting of prolonged tachycardia arrhythmia.  Patient does not have any significant risk factors for premature atherosclerotic heart disease.  However, his EKG demonstrates some ischemic changes.  Therefore, further evaluation is oriented. - Administer aspirin 325 mg 1 followed by 81 mg daily. - Obtain troponin I now and trend every 6 hours until they have peaked. - Check fasting lipid panel in the morning. - Patient will likely need ischemia evaluation of some sort.  Will defer decision of stress test versus catheterization based on his hospital course and troponin trend.  Leukocytosis: Unclear etiology, though this may reflect an acute stress response to his prolonged episode of tachycardia.  However, he also reports a preceding URI with continued productive cough, reasonable possibility for atypical pneumonia.  Of note, his chest radiograph does not show any clear infiltrates.  He does not have any other symptoms to localize possible infection.  However, think it is reasonable to empiric  treatment for possible community-acquired pneumonia. - Initiate ceftriaxone and azithromycin. - Obtain blood cultures. - At differential to CBC obtained in the ER and repeat CBC in the morning.  Abdominal discomfort/bulge: Patient currently is without symptoms and there are no objective findings on physical exam.  This may be an abdominal wall hernia that is intermittently present.  Will not pursue any further workup at this time unless it becomes symptomatic.  CODE STATUS: Full code.  Signed, Yvonne Kendall MD 10/02/2015, 1:28 AM

## 2015-10-02 NOTE — Progress Notes (Signed)
DC order rcvd.  Telemetry removed and CCMD notified.  IV removed with catheter intact.  Reinforced discharge education related to medications with Pt with wife at bedside.  All questions answered.  Pt discharged with no chest pain or sob.  Pt stable at time of discharge.  Pt discharged ambulatory with wife.

## 2015-10-04 ENCOUNTER — Encounter (HOSPITAL_COMMUNITY): Admission: RE | Payer: Self-pay | Source: Ambulatory Visit

## 2015-10-04 ENCOUNTER — Ambulatory Visit (HOSPITAL_COMMUNITY)
Admission: RE | Admit: 2015-10-04 | Payer: PRIVATE HEALTH INSURANCE | Source: Ambulatory Visit | Admitting: Cardiovascular Disease

## 2015-10-04 SURGERY — CARDIOVERSION
Anesthesia: Monitor Anesthesia Care

## 2015-10-04 MED FILL — Heparin Sodium (Porcine) 2 Unit/ML in Sodium Chloride 0.9%: INTRAMUSCULAR | Qty: 500 | Status: AC

## 2015-10-07 LAB — CULTURE, BLOOD (ROUTINE X 2)
Culture: NO GROWTH
Culture: NO GROWTH

## 2015-10-09 ENCOUNTER — Encounter (HOSPITAL_COMMUNITY): Payer: Self-pay | Admitting: *Deleted

## 2015-10-18 ENCOUNTER — Telehealth: Payer: Self-pay | Admitting: Internal Medicine

## 2015-10-18 NOTE — Telephone Encounter (Signed)
Follow Up  Please call back to sch the ablation ( Please consult with Dr. Johney FrameAllred per wife request)    Routed to Dennis BastKelly Lanier

## 2015-10-18 NOTE — Telephone Encounter (Addendum)
Spoke with patient's wife and let her know I would discuss with Dr Johney FrameAllred on Mon and call her back to schedule the appointment.  Could schedule in the afib clinic in the next few weeks as he wants to have this done in January

## 2015-10-18 NOTE — Telephone Encounter (Signed)
Follow Up  Please call back to sch the ablation ( Please consult with Dr. Johney FrameAllred per wife request)

## 2015-10-23 NOTE — Telephone Encounter (Signed)
Pt to come in for appt 1/6 to further discuss ablation and schedule accordingly.  Wife agreeable to this.

## 2015-11-15 ENCOUNTER — Ambulatory Visit (HOSPITAL_COMMUNITY)
Admission: RE | Admit: 2015-11-15 | Discharge: 2015-11-15 | Disposition: A | Discharge status: Home / Self Care | Payer: BLUE CROSS/BLUE SHIELD | Source: Ambulatory Visit | Attending: Nurse Practitioner | Admitting: Nurse Practitioner

## 2015-11-15 VITALS — BP 108/78 | HR 83 | Ht 79.0 in | Wt 269.0 lb

## 2015-11-15 DIAGNOSIS — I480 Paroxysmal atrial fibrillation: Secondary | ICD-10-CM | POA: Insufficient documentation

## 2015-11-15 DIAGNOSIS — Z79899 Other long term (current) drug therapy: Secondary | ICD-10-CM | POA: Insufficient documentation

## 2015-11-15 DIAGNOSIS — I4892 Unspecified atrial flutter: Secondary | ICD-10-CM | POA: Insufficient documentation

## 2015-11-15 MED ORDER — RIVAROXABAN 20 MG PO TABS: 20.0000 mg | ORAL_TABLET | Freq: Every day | ORAL | Status: DC

## 2015-11-15 NOTE — Patient Instructions (Signed)
Your physician has recommended you make the following change in your medication:  1)Start Xarelto 20mg once a day with supper  

## 2015-11-15 NOTE — Progress Notes (Signed)
Patient ID: Gregory Peck, male   DOB: 12-18-1970, 45 y.o.   MRN: [id number]     Primary Care Physician: Lorretta HarpPANOSH,[name], MD Referring Physician: Dr. Judi SaaAllred   Filipe Remer MachoMarc Romm is a 45 y.o. male with a h/o symptomatic paroxysmal atrial fibrillation. He has had atrial fibrillation for several years. He has had difficulty with tachypalpitations and fatigue. He has tried flecainide without real success. He has not tolerated daily medicine. He has an occasional arrhythmia which he describes as "more aggressive" which is poorly tolerated with presyncope. This is felt to possibly be due to rapidly conducting atrial flutter. He finds that his arrhythmia is worsened by lack of sleep or stress. He has episodes every few months lasting up to 18 hours. He continues to take flecainide as a "pill in pocket" option. He has had prior syncope which is felt to be due to a rapid conduction of his arrhythmia by Dr Graciela HusbandsKlein.  He was seen by Dr. Johney FrameAllred in June and informed for an ablation, he was undecided,  Then was  was suppose to f/u in November for the procedure but did not carry through with plans. He is now wanting to schedule ablation and has been referred here. He was fully consented by Dr. Johney FrameAllred in June and this information, risk vrs benefit, was reviewed with pt again. He has a chadsvasc score of 0 and will be started on xarelto 20 mg in preparation for ablation. He continues to use flecainide as a pill in pocket approach for his afib and flutter. He also has metoprolol to use as well.   Today, he denies symptoms of palpitations, chest pain, shortness of breath, orthopnea, PND, lower extremity edema, dizziness, presyncope, syncope, or neurologic sequela. The patient is tolerating medications without difficulties and is otherwise without complaint today.   Past Medical History  Diagnosis Date  . History of syncope   . Asymptomatic varicose veins   . Atrial fibrillation (HCC)   . Rectal bleed    Colonoscopy age 45 hemorrhoids Buccini   Past Surgical History  Procedure Laterality Date  . Knee arthroscopy    . Varicolectomy  1992  . Cardiac catheterization N/A 10/02/2015    Procedure: Left Heart Cath and Coronary Angiography;  Surgeon: Iran OuchMuhammad A Arida, MD;  Location: [facility];  Service: Cardiovascular;  Laterality: N/A;    Current Outpatient Prescriptions  Medication Sig Dispense Refill  . metoprolol tartrate (LOPRESSOR) 25 MG tablet Take 0.5 tablets (12.5 mg total) by mouth every 6 (six) hours as needed (palpitations). (Patient not taking: Reported on 11/15/2015) 60 tablet 3  . rivaroxaban (XARELTO) 20 MG TABS tablet Take 1 tablet (20 mg total) by mouth daily with supper. 30 tablet 1   No current facility-administered medications for this encounter.    No Known Allergies  Social History   Social History  . Marital Status: Married    Spouse Name: N/A  . Number of Children: N/A  . Years of Education: N/A   Occupational History  . Not on file.   Social History Main Topics  . Smoking status: Never Smoker   . Smokeless tobacco: Not on file  . Alcohol Use: No  . Drug Use: Not on file  . Sexual Activity: Not on file   Other Topics Concern  . 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 Hypertension    . Hyperlipidemia    . Hearing loss    . Other      arrythmia    ROS- All systems are reviewed and negative except as per the HPI above  Physical Exam: Filed Vitals:   11/15/15 0926  BP: 108/78  Pulse: 83  Height: 6\' 7"  (2.007 m)  Weight: 269 lb (122.018 kg)    GEN- The patient is well appearing, alert and oriented x 3 today.   Head- normocephalic, atraumatic Eyes-  Sclera clear, conjunctiva  pink Ears- hearing intact Oropharynx- clear Neck- supple, no JVP Lymph- no cervical lymphadenopathy Lungs- Clear to ausculation bilaterally, normal work of breathing Heart- Regular rate and rhythm, no murmurs, rubs or gallops, PMI not laterally displaced GI- soft, NT, ND, + BS Extremities- no clubbing, cyanosis, or edema MS- no significant deformity or atrophy Skin- no rash or lesion Psych- euthymic mood, full affect Neuro- strength and sensation are intact  EKG-NSR with RAD, print 152 ms, qrs int 96 ms, qtc 427 ms Epic records reviewed  Assessment and Plan: 1. Paroxysmal atrial fibrillation/ atrial flutter Per Dr. Jenel Lucks Note in June: "The patient has symptomatic recurrent atrial fibrillation. He may also have atrial flutter by symptoms though this has not been as well documented. He has failed medical therapy with flecainide and diltiazem. Further medical therapy is limited by prior syncope as well as fatigue. I agree with Dr Graciela Husbands that ablation is a good option for this patient. Given his rapid arrhythmia and symptoms, I think that it is medically necessary to maintain sinus rhythm long term. Therapeutic strategies for afib including medicine and ablation were discussed in detail with the patient today. Risk, benefits, and alternatives to EP study and radiofrequency ablation were also discussed in detail today. These risks include but are not limited to stroke, bleeding, vascular damage, tamponade, perforation, damage to the esophagus, lungs, and other structures, pulmonary vein stenosis, worsening renal function, and death. The patient understands these risk and wishes to consider ablation in October (based on work related timing). He is aware that he would need to start anticoagulation (Xarelto) 3 weeks prior to ablation. He will contact our office if he decides to proceed with ablation in the interim."  Pt is scheduled for afib/flutter ablation 2/7 TEE is scheduled same day at  8am, and procedure at 10 am. Labs scheduled for 1/31. He will start xarelto 20 mg daily, with Creat/cl cal at 154.9  Kale Dols C. [facility] [address] [place], [place] [address] [phone]

## 2015-12-02 ENCOUNTER — Telehealth: Payer: Self-pay | Admitting: Internal Medicine

## 2015-12-02 NOTE — Telephone Encounter (Signed)
Pt calling about precert for her husband's 12-17-15 ablation. Pt given CPT code 45409.  She will call [employer] and [employer] to see what their cost would be.  They may cancel pt's BCBS which is thru his employer and only keep the [facility] as it may be too expensive to keep both.  I will do the prior auth for [employer]BS and [employer] for the afib clinic.

## 2015-12-03 ENCOUNTER — Telehealth: Payer: Self-pay | Admitting: Internal Medicine

## 2015-12-03 NOTE — Telephone Encounter (Signed)
Notified pt's wife that precert has been obtained for [facility] for TEE and ablation.  Notified Bissero A, [phone] of [employer] of afib ablation and TEE (no prior auth required).  Gave approximate quote for physician fee only - Ablation CPT 93656, $3,250 and TEE CPT 93312, $275.  Pt understands these fees do not include any hospital charges.  Pt will contact their insurance company to decide if will continue to keep BCBS for Feb 2017 as it has a $5,000 deductible and then a co-insurance.  Ky Barban is $3,750 for entire family and then pays 100% of contracted rate.

## 2015-12-10 ENCOUNTER — Other Ambulatory Visit: Payer: BLUE CROSS/BLUE SHIELD

## 2015-12-12 ENCOUNTER — Telehealth: Payer: Self-pay | Admitting: Internal Medicine

## 2015-12-12 ENCOUNTER — Encounter (HOSPITAL_COMMUNITY): Payer: Self-pay | Admitting: *Deleted

## 2015-12-12 ENCOUNTER — Other Ambulatory Visit (HOSPITAL_COMMUNITY): Payer: Self-pay | Admitting: *Deleted

## 2015-12-12 ENCOUNTER — Encounter (INDEPENDENT_AMBULATORY_CARE_PROVIDER_SITE_OTHER): Payer: Self-pay

## 2015-12-12 DIAGNOSIS — I4819 Other persistent atrial fibrillation: Secondary | ICD-10-CM

## 2015-12-12 NOTE — Telephone Encounter (Signed)
Letter that was originally provided for patient was reviewed with wife again. Reiterated importance of having lab work performed prior to ablation. Appt rescheduled that was missed for bloodwork earlier this week. All instructions were also sent via mychart. Wife verbalized understanding of when to arrive on 2/7 @ 630 am, NPO after MN, no missed doses of xarelto and to have pre-op lab work performed.

## 2015-12-12 NOTE — Telephone Encounter (Signed)
New message    Patient wife calling has not receive any information on her husband upcoming procedure.

## 2015-12-13 ENCOUNTER — Other Ambulatory Visit (INDEPENDENT_AMBULATORY_CARE_PROVIDER_SITE_OTHER): Payer: PRIVATE HEALTH INSURANCE | Admitting: *Deleted

## 2015-12-13 DIAGNOSIS — I481 Persistent atrial fibrillation: Secondary | ICD-10-CM

## 2015-12-13 DIAGNOSIS — I4819 Other persistent atrial fibrillation: Secondary | ICD-10-CM

## 2015-12-13 LAB — CBC WITH DIFFERENTIAL/PLATELET
BASOS ABS: 0 10*3/uL (ref 0.0–0.1)
Basophils Relative: 0 % (ref 0–1)
Eosinophils Absolute: 0.1 10*3/uL (ref 0.0–0.7)
Eosinophils Relative: 1 % (ref 0–5)
HEMATOCRIT: 46.1 % (ref 39.0–52.0)
HEMOGLOBIN: 15.6 g/dL (ref 13.0–17.0)
LYMPHS PCT: 28 % (ref 12–46)
Lymphs Abs: 2.3 10*3/uL (ref 0.7–4.0)
MCH: 27.1 pg (ref 26.0–34.0)
MCHC: 33.8 g/dL (ref 30.0–36.0)
MCV: 80 fL (ref 78.0–100.0)
MPV: 8.7 fL (ref 8.6–12.4)
Monocytes Absolute: 0.9 10*3/uL (ref 0.1–1.0)
Monocytes Relative: 11 % (ref 3–12)
NEUTROS ABS: 4.9 10*3/uL (ref 1.7–7.7)
Neutrophils Relative %: 60 % (ref 43–77)
Platelets: 238 10*3/uL (ref 150–400)
RBC: 5.76 MIL/uL (ref 4.22–5.81)
RDW: 15.7 % — ABNORMAL HIGH (ref 11.5–15.5)
WBC: 8.2 10*3/uL (ref 4.0–10.5)

## 2015-12-13 LAB — BASIC METABOLIC PANEL
BUN: 13 mg/dL (ref 7–25)
CHLORIDE: 104 mmol/L (ref 98–110)
CO2: 27 mmol/L (ref 20–31)
Calcium: 9.7 mg/dL (ref 8.6–10.3)
Creat: 1.02 mg/dL (ref 0.60–1.35)
GLUCOSE: 89 mg/dL (ref 65–99)
POTASSIUM: 4 mmol/L (ref 3.5–5.3)
SODIUM: 140 mmol/L (ref 135–146)

## 2015-12-13 NOTE — Addendum Note (Signed)
Addended by: Sharene Krikorian K on: 12/13/2015 02:00 PM   Modules accepted: Orders  

## 2015-12-13 NOTE — Addendum Note (Signed)
Addended by: Tonita Phoenix on: 12/13/2015 02:00 PM   Modules accepted: Orders

## 2015-12-15 ENCOUNTER — Encounter (HOSPITAL_COMMUNITY): Payer: Self-pay | Admitting: Cardiovascular Disease

## 2015-12-16 ENCOUNTER — Encounter (HOSPITAL_COMMUNITY): Payer: Self-pay | Admitting: Cardiovascular Disease

## 2015-12-16 NOTE — Anesthesia Preprocedure Evaluation (Addendum)
Anesthesia Evaluation  Patient identified by MRN, date of birth, ID band Patient awake    Reviewed: Allergy & Precautions, NPO status , Patient's Chart, lab work & pertinent test results, reviewed documented beta blocker date and time   Airway Mallampati: I  TM Distance: >3 FB Neck ROM: Full    Dental  (+) Teeth Intact, Dental Advisory Given   Pulmonary neg pulmonary ROS,    Pulmonary exam normal breath sounds clear to auscultation       Cardiovascular + Past MI  Normal cardiovascular exam+ dysrhythmias Atrial Fibrillation  Rhythm:Regular Rate:Normal  LHC 10/02/15: 1. Normal coronary arteries. 2. Normal LV systolic function. Normal left ventricular end-diastolic pressure.  TTE 04/02/15: Study Conclusions  - Left ventricle: The cavity size was normal. Systolic function wasnormal. The estimated ejection fraction was in the range of 50%to 55%. Wall motion was normal; there were no regional wallmotion abnormalities. - Left atrium: The atrium was mildly dilated. - Right atrium: The atrium was mildly dilated. - Atrial septum: No defect or patent foramen ovale was identified. - Pulmonary arteries: PA peak pressure: 35 mm Hg (S).    Neuro/Psych negative neurological ROS  negative psych ROS   GI/Hepatic negative GI ROS, Neg liver ROS,   Endo/Other  Obesity   Renal/GU negative Renal ROS     Musculoskeletal negative musculoskeletal ROS (+)   Abdominal   Peds  Hematology  (+) Blood dyscrasia (rivaroxaban), ,   Anesthesia Other Findings Day of surgery medications reviewed with the patient.  Reproductive/Obstetrics                          Anesthesia Physical Anesthesia Plan  ASA: III  Anesthesia Plan: MAC   Post-op Pain Management:    Induction: Intravenous  Airway Management Planned: Simple Face Mask  Additional Equipment:   Intra-op Plan:   Post-operative Plan:   Informed  Consent: I have reviewed the patients History and Physical, chart, labs and discussed the procedure including the risks, benefits and alternatives for the proposed anesthesia with the patient or authorized representative who has indicated his/her understanding and acceptance.   Dental advisory given  Plan Discussed with: CRNA  Anesthesia Plan Comments: (Discussed risks/benefits/alternatives to MAC sedation including need for ventilatory support, hypotension, need for conversion to general anesthesia.  All patient questions answered.  Patient wished to proceed.)        Anesthesia Quick Evaluation

## 2015-12-16 NOTE — H&P (Signed)
From Clinic note 11/15/15.  No significant changes noted.       Primary Care Physician: Lorretta Harp, MD Referring Physician: Dr. Judi Saa Per Beagley is a 45 y.o. male with a h/o symptomatic paroxysmal atrial fibrillation. He has had atrial fibrillation for several years. He has had difficulty with tachypalpitations and fatigue. He has tried flecainide without real success. He has not tolerated daily medicine. He has an occasional arrhythmia which he describes as "more aggressive" which is poorly tolerated with presyncope. This is felt to possibly be due to rapidly conducting atrial flutter. He finds that his arrhythmia is worsened by lack of sleep or stress. He has episodes every few months lasting up to 18 hours. He continues to take flecainide as a "pill in pocket" option. He has had prior syncope which is felt to be due to a rapid conduction of his arrhythmia by Dr Graciela Husbands.  He was seen by Dr. Johney Frame in June and informed for an ablation, he was undecided, Then was was suppose to f/u in November for the procedure but did not carry through with plans. He is now wanting to schedule ablation and has been referred here. He was fully consented by Dr. Johney Frame in June and this information, risk vrs benefit, was reviewed with pt again. He has a chadsvasc score of 0 and will be started on xarelto 20 mg in preparation for ablation. He continues to use flecainide as a pill in pocket approach for his afib and flutter. He also has metoprolol to use as well.   Today, he denies symptoms of palpitations, chest pain, shortness of breath, orthopnea, PND, lower extremity edema, dizziness, presyncope, syncope, or neurologic sequela. The patient is tolerating medications without difficulties and is otherwise without complaint today.   Past Medical History  Diagnosis Date  . History of syncope   . Asymptomatic varicose veins   . Atrial fibrillation (HCC)   . Rectal bleed      Colonoscopy age 30 hemorrhoids Buccini   Past Surgical History  Procedure Laterality Date  . Knee arthroscopy    . Varicolectomy  1992  . Cardiac catheterization N/A 10/02/2015    Procedure: Left Heart Cath and Coronary Angiography; Surgeon: Iran Ouch, MD; [facility]: [facility]; Service: Cardiovascular; Laterality: N/A;    Current Outpatient Prescriptions  Medication Sig Dispense Refill  . metoprolol tartrate (LOPRESSOR) 25 MG tablet Take 0.5 tablets (12.5 mg total) by mouth every 6 (six) hours as needed (palpitations). (Patient not taking: Reported on 11/15/2015) 60 tablet 3  . rivaroxaban (XARELTO) 20 MG TABS tablet Take 1 tablet (20 mg total) by mouth daily with supper. 30 tablet 1   No current facility-administered medications for this encounter.    No Known Allergies  Social History   Social History  . Marital Status: Married    Spouse Name: N/A  . Number of Children: N/A  . Years of Education: N/A   Occupational History  . Not on file.   Social History Main Topics  . Smoking status: Never Smoker   . Smokeless tobacco: Not on file  . Alcohol Use: No  . Drug Use: Not on file  . Sexual Activity: Not on file   Other Topics Concern  . 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 Hypertension    . Hyperlipidemia    . Hearing loss    . Other      arrythmia    ROS- All systems are reviewed and negative except as per the HPI above  Physical Exam: Filed Vitals:   11/15/15 0926  BP: 108/78  Pulse: 83  Height:  (2.007 m)  Weight: 269 lb (122.018 kg)     GEN- The patient is well appearing, alert and oriented x 3 today.  Head- normocephalic, atraumatic Eyes- Sclera clear, conjunctiva pink Ears- hearing intact Oropharynx- clear Neck- supple, no JVP Lymph- no cervical lymphadenopathy Lungs- Clear to ausculation bilaterally, normal work of breathing Heart- Regular rate and rhythm, no murmurs, rubs or gallops, PMI not laterally displaced GI- soft, NT, ND, + BS Extremities- no clubbing, cyanosis, or edema MS- no significant deformity or atrophy Skin- no rash or lesion Psych- euthymic mood, full affect Neuro- strength and sensation are intact  EKG-NSR with RAD, print 152 ms, qrs int 96 ms, qtc 427 ms Epic records reviewed  Assessment and Plan: 1. Paroxysmal atrial fibrillation/ atrial flutter Per Dr. Jenel Lucks Note in June: "The patient has symptomatic recurrent atrial fibrillation. He may also have atrial flutter by symptoms though this has not been as well documented. He has failed medical therapy with flecainide and diltiazem. Further medical therapy is limited by prior syncope as well as fatigue. I agree with Dr Graciela Husbands that ablation is a good option for this patient. Given his rapid arrhythmia and symptoms, I think that it is medically necessary to maintain sinus rhythm long term. Therapeutic strategies for afib including medicine and ablation were discussed in detail with the patient today. Risk, benefits, and alternatives to EP study and radiofrequency ablation were also discussed in detail today. These risks include but are not limited to stroke, bleeding, vascular damage, tamponade, perforation, damage to the esophagus, lungs, and other structures, pulmonary vein stenosis, worsening renal function, and death. The patient understands these risk and wishes to consider ablation in October (based on work related timing). He is aware that he would need to start anticoagulation (Xarelto) 3 weeks prior to ablation. He will contact our  office if he decides to proceed with ablation in the interim."  Pt is scheduled for afib/flutter ablation 2/7 TEE is scheduled same day at 8am, and procedure at 10 am. Labs scheduled for 1/31. He will start xarelto 20 mg daily, with Creat/cl cal at 154.9  Donna [facility] [address] [place], [place] [address] [phone]                 [facility] has been requested to perform a transesophageal echocardiogram on Retta Diones for pre-ablation, atrial fibrillation.  After careful review of history and examination, the risks and benefits of transesophageal echocardiogram have been explained including risks of esophageal damage, perforation (1:10,000 risk), bleeding, pharyngeal hematoma as well as other potential complications associated with conscious sedation including aspiration, arrhythmia, respiratory failure and death. Alternatives to treatment were discussed, questions were answered. Patient is willing to proceed.   Jaydenn Boccio C. Duke Salvia, MD, [facility]  12/16/2015 11:03 PM

## 2015-12-17 ENCOUNTER — Ambulatory Visit (HOSPITAL_COMMUNITY)
Admission: RE | Admit: 2015-12-17 | Discharge: 2015-12-18 | Disposition: A | Discharge status: Home / Self Care | Payer: No Typology Code available for payment source | Source: Ambulatory Visit | Attending: Internal Medicine | Admitting: Internal Medicine

## 2015-12-17 ENCOUNTER — Ambulatory Visit (HOSPITAL_BASED_OUTPATIENT_CLINIC_OR_DEPARTMENT_OTHER): Payer: No Typology Code available for payment source

## 2015-12-17 ENCOUNTER — Encounter (HOSPITAL_COMMUNITY)
Admission: RE | Disposition: A | Discharge status: Home / Self Care | Payer: Self-pay | Source: Ambulatory Visit | Attending: Internal Medicine

## 2015-12-17 ENCOUNTER — Encounter (HOSPITAL_COMMUNITY): Payer: Self-pay | Admitting: *Deleted

## 2015-12-17 ENCOUNTER — Ambulatory Visit (HOSPITAL_COMMUNITY): Payer: No Typology Code available for payment source | Admitting: Anesthesiology

## 2015-12-17 DIAGNOSIS — Z7901 Long term (current) use of anticoagulants: Secondary | ICD-10-CM | POA: Diagnosis not present

## 2015-12-17 DIAGNOSIS — I483 Typical atrial flutter: Secondary | ICD-10-CM | POA: Insufficient documentation

## 2015-12-17 DIAGNOSIS — I4891 Unspecified atrial fibrillation: Secondary | ICD-10-CM | POA: Diagnosis not present

## 2015-12-17 DIAGNOSIS — Z8249 Family history of ischemic heart disease and other diseases of the circulatory system: Secondary | ICD-10-CM | POA: Insufficient documentation

## 2015-12-17 DIAGNOSIS — I480 Paroxysmal atrial fibrillation: Secondary | ICD-10-CM | POA: Diagnosis not present

## 2015-12-17 HISTORY — PX: ELECTROPHYSIOLOGIC STUDY: SHX172A

## 2015-12-17 HISTORY — PX: TEE WITHOUT CARDIOVERSION: SHX5443

## 2015-12-17 LAB — POCT ACTIVATED CLOTTING TIME
ACTIVATED CLOTTING TIME: 240 s
ACTIVATED CLOTTING TIME: 255 s
ACTIVATED CLOTTING TIME: 296 s
ACTIVATED CLOTTING TIME: 188 s
Activated Clotting Time: 281 seconds

## 2015-12-17 LAB — MRSA PCR SCREENING: MRSA by PCR: NEGATIVE

## 2015-12-17 SURGERY — ECHOCARDIOGRAM, TRANSESOPHAGEAL
Anesthesia: Moderate Sedation

## 2015-12-17 SURGERY — ATRIAL FIBRILLATION ABLATION
Anesthesia: Monitor Anesthesia Care

## 2015-12-17 MED ORDER — BUPIVACAINE HCL (PF) 0.25 % IJ SOLN
INTRAMUSCULAR | Status: AC
  Filled 2015-12-17: qty 30

## 2015-12-17 MED ORDER — FENTANYL CITRATE (PF) 100 MCG/2ML IJ SOLN
INTRAMUSCULAR | Status: DC | PRN
  Administered 2015-12-17: 25 ug via INTRAVENOUS
  Administered 2015-12-17: 12.5 ug via INTRAVENOUS
  Administered 2015-12-17: 50 ug via INTRAVENOUS

## 2015-12-17 MED ORDER — RIVAROXABAN 20 MG PO TABS
20.0000 mg | ORAL_TABLET | Freq: Every day | ORAL | Status: DC
  Administered 2015-12-17: 20 mg via ORAL
  Filled 2015-12-17: qty 1

## 2015-12-17 MED ORDER — LACTATED RINGERS IV SOLN
INTRAVENOUS | Status: DC | PRN
  Administered 2015-12-17 (×2): via INTRAVENOUS

## 2015-12-17 MED ORDER — DOBUTAMINE IN D5W 4-5 MG/ML-% IV SOLN
INTRAVENOUS | Status: AC
  Filled 2015-12-17: qty 250

## 2015-12-17 MED ORDER — MIDAZOLAM HCL 10 MG/2ML IJ SOLN
INTRAMUSCULAR | Status: DC | PRN
  Administered 2015-12-17: 1 mg via INTRAVENOUS
  Administered 2015-12-17: 2 mg via INTRAVENOUS
  Administered 2015-12-17: 1 mg via INTRAVENOUS

## 2015-12-17 MED ORDER — BUPIVACAINE HCL (PF) 0.25 % IJ SOLN
INTRAMUSCULAR | Status: DC | PRN
  Administered 2015-12-17: 20 mL

## 2015-12-17 MED ORDER — IOHEXOL 350 MG/ML SOLN
INTRAVENOUS | Status: DC | PRN
  Administered 2015-12-17: 100 mL via INTRACARDIAC
  Administered 2015-12-17: 3 mL via INTRACARDIAC

## 2015-12-17 MED ORDER — LIDOCAINE VISCOUS 2 % MT SOLN
OROMUCOSAL | Status: AC
  Filled 2015-12-17: qty 15

## 2015-12-17 MED ORDER — HYDROCODONE-ACETAMINOPHEN 5-325 MG PO TABS
1.0000 | ORAL_TABLET | ORAL | Status: DC | PRN
  Administered 2015-12-17: 2 via ORAL
  Administered 2015-12-17 (×2): 1 via ORAL
  Filled 2015-12-17: qty 2

## 2015-12-17 MED ORDER — SODIUM CHLORIDE 0.9% FLUSH: 3.0000 mL | INTRAVENOUS | Status: DC | PRN

## 2015-12-17 MED ORDER — PROPOFOL 10 MG/ML IV BOLUS
INTRAVENOUS | Status: DC | PRN
  Administered 2015-12-17 (×3): 10 mg via INTRAVENOUS
  Administered 2015-12-17: 15 mg via INTRAVENOUS

## 2015-12-17 MED ORDER — HEPARIN SODIUM (PORCINE) 1000 UNIT/ML IJ SOLN
INTRAMUSCULAR | Status: DC | PRN
Start: 2015-12-17 — End: 2015-12-17
  Administered 2015-12-17 (×2): 4000 [IU] via INTRAVENOUS
  Administered 2015-12-17: 5000 [IU] via INTRAVENOUS

## 2015-12-17 MED ORDER — HEPARIN SODIUM (PORCINE) 1000 UNIT/ML IJ SOLN
INTRAMUSCULAR | Status: DC | PRN
  Administered 2015-12-17 (×3): 1000 [IU] via INTRAVENOUS
  Administered 2015-12-17: 12000 [IU]

## 2015-12-17 MED ORDER — FENTANYL CITRATE (PF) 100 MCG/2ML IJ SOLN
INTRAMUSCULAR | Status: AC
  Filled 2015-12-17: qty 2

## 2015-12-17 MED ORDER — DIPHENHYDRAMINE HCL 50 MG/ML IJ SOLN
INTRAMUSCULAR | Status: AC
  Filled 2015-12-17: qty 1

## 2015-12-17 MED ORDER — HYDROCODONE-ACETAMINOPHEN 5-325 MG PO TABS
ORAL_TABLET | ORAL | Status: AC
  Filled 2015-12-17: qty 1

## 2015-12-17 MED ORDER — MIDAZOLAM HCL 5 MG/ML IJ SOLN
INTRAMUSCULAR | Status: AC
  Filled 2015-12-17: qty 2

## 2015-12-17 MED ORDER — HEPARIN SODIUM (PORCINE) 1000 UNIT/ML IJ SOLN
INTRAMUSCULAR | Status: AC
  Filled 2015-12-17: qty 1

## 2015-12-17 MED ORDER — PROPOFOL 500 MG/50ML IV EMUL
INTRAVENOUS | Status: DC | PRN
  Administered 2015-12-17: 12:00:00 via INTRAVENOUS
  Administered 2015-12-17: 125 ug/kg/min via INTRAVENOUS
  Administered 2015-12-17: 14:00:00 via INTRAVENOUS

## 2015-12-17 MED ORDER — DIPHENHYDRAMINE HCL 50 MG/ML IJ SOLN
INTRAMUSCULAR | Status: DC | PRN
  Administered 2015-12-17 (×2): 25 mg via INTRAVENOUS

## 2015-12-17 MED ORDER — SODIUM CHLORIDE 0.9 % IV SOLN
INTRAVENOUS | Status: DC
  Administered 2015-12-17: 500 mL via INTRAVENOUS

## 2015-12-17 MED ORDER — ACETAMINOPHEN 325 MG PO TABS
650.0000 mg | ORAL_TABLET | ORAL | Status: DC | PRN
  Administered 2015-12-18: 650 mg via ORAL
  Filled 2015-12-17: qty 2

## 2015-12-17 MED ORDER — SODIUM CHLORIDE 0.9% FLUSH
3.0000 mL | Freq: Two times a day (BID) | INTRAVENOUS | Status: DC
  Administered 2015-12-17: 3 mL via INTRAVENOUS

## 2015-12-17 MED ORDER — ONDANSETRON HCL 4 MG/2ML IJ SOLN: 4.0000 mg | Freq: Four times a day (QID) | INTRAMUSCULAR | Status: DC | PRN

## 2015-12-17 MED ORDER — SODIUM CHLORIDE 0.9 % IV SOLN: 250.0000 mL | INTRAVENOUS | Status: DC | PRN

## 2015-12-17 MED ORDER — PROTAMINE SULFATE 10 MG/ML IV SOLN
INTRAVENOUS | Status: DC | PRN
  Administered 2015-12-17 (×3): 10 mg via INTRAVENOUS

## 2015-12-17 MED ORDER — LIDOCAINE HCL (CARDIAC) 20 MG/ML IV SOLN
INTRAVENOUS | Status: DC | PRN
  Administered 2015-12-17: 60 mg via INTRATRACHEAL

## 2015-12-17 MED ORDER — DOBUTAMINE IN D5W 4-5 MG/ML-% IV SOLN
INTRAVENOUS | Status: DC | PRN
  Administered 2015-12-17: 20 ug/kg/min via INTRAVENOUS

## 2015-12-17 MED ORDER — FENTANYL CITRATE (PF) 100 MCG/2ML IJ SOLN
INTRAMUSCULAR | Status: DC | PRN
  Administered 2015-12-17 (×3): 25 ug via INTRAVENOUS
  Administered 2015-12-17 (×2): 50 ug via INTRAVENOUS
  Administered 2015-12-17: 25 ug via INTRAVENOUS
  Administered 2015-12-17: 50 ug via INTRAVENOUS

## 2015-12-17 SURGICAL SUPPLY — 21 items
BAG SNAP BAND KOVER 36X36 (MISCELLANEOUS) ×3 IMPLANT
BLANKET WARM UNDERBOD FULL ACC (MISCELLANEOUS) ×3 IMPLANT
CATH DIAG 6FR PIGTAIL (CATHETERS) ×3 IMPLANT
CATH NAVISTAR SMARTTOUCH DF (ABLATOR) ×3 IMPLANT
CATH SOUNDSTAR ECO REPROCESSED (CATHETERS) ×3 IMPLANT
CATH VARIABLE LASSO NAV 2515 (CATHETERS) ×3 IMPLANT
CATH WEBSTER BI DIR CS D-F CRV (CATHETERS) ×3 IMPLANT
COVER SWIFTLINK CONNECTOR (BAG) ×3 IMPLANT
NEEDLE TRANSEP BRK 71CM 407200 (NEEDLE) ×3 IMPLANT
PACK EP LATEX FREE (CUSTOM PROCEDURE TRAY) ×3
PACK EP LF (CUSTOM PROCEDURE TRAY) ×1 IMPLANT
PAD DEFIB LIFELINK (PAD) ×3 IMPLANT
PATCH CARTO3 (PAD) ×3 IMPLANT
SHEATH AVANTI 11F 11CM (SHEATH) ×3 IMPLANT
SHEATH PINNACLE 7F 10CM (SHEATH) ×6 IMPLANT
SHEATH PINNACLE 9F 10CM (SHEATH) ×3 IMPLANT
SHEATH SWARTZ TS SL2 63CM 8.5F (SHEATH) ×3 IMPLANT
SHIELD RADPAD SCOOP 12X17 (MISCELLANEOUS) ×3 IMPLANT
SYR MEDRAD MARK V 150ML (SYRINGE) ×3 IMPLANT
TUBING CONTRAST HIGH PRESS 48 (TUBING) ×3 IMPLANT
TUBING SMART ABLATE COOLFLOW (TUBING) ×3 IMPLANT

## 2015-12-17 NOTE — Transfer of Care (Signed)
Immediate Anesthesia Transfer of Care Note  Patient: Gregory Peck  Procedure(s) Performed: Procedure(s): Atrial Fibrillation Ablation (N/A)  Patient Location: PACU (in EP lab)  Anesthesia Type:MAC  Level of Consciousness: awake, alert  and oriented  Airway & Oxygen Therapy: Patient Spontanous Breathing  Post-op Assessment: Report given to RN and Post -op Vital signs reviewed and stable  Post vital signs: Reviewed and stable  Last Vitals:  Filed Vitals:   12/17/15 0950 12/17/15 1000  BP:    Pulse: 73 56  Temp:    Resp: 16 17    Complications: No apparent anesthesia complications

## 2015-12-17 NOTE — Anesthesia Procedure Notes (Signed)
Procedure Name: MAC Date/Time: 12/17/2015 10:41 AM Performed by: Faustino Congress Kortni Hasten Pre-anesthesia Checklist: Patient identified, Emergency Drugs available, Suction available and Patient being monitored Patient Re-evaluated:Patient Re-evaluated prior to inductionOxygen Delivery Method: Nasal cannula

## 2015-12-17 NOTE — Progress Notes (Signed)
  Echocardiogram Echocardiogram Transesophageal has been performed.  Gregory Peck 12/17/2015, 8:35 AM

## 2015-12-17 NOTE — Progress Notes (Signed)
Wife in to see. Still c/o pain rt shoulder; wife massaging. Warm packs applied to rt shoulder. Dr. Johney Frame made aware.

## 2015-12-17 NOTE — Anesthesia Postprocedure Evaluation (Signed)
Anesthesia Post Note  Patient: Gregory Peck  Procedure(s) Performed: Procedure(s) (LRB): Atrial Fibrillation Ablation (N/A)  Patient location during evaluation: PACU Anesthesia Type: MAC Level of consciousness: awake and alert Pain management: pain level controlled Vital Signs Assessment: post-procedure vital signs reviewed and stable Respiratory status: spontaneous breathing, nonlabored ventilation, respiratory function stable and patient connected to nasal cannula oxygen Cardiovascular status: stable and blood pressure returned to baseline Anesthetic complications: no    Last Vitals:  Filed Vitals:   12/17/15 1555 12/17/15 1600  BP: 100/59 91/55  Pulse: 58 57  Temp:    Resp: 13 11    Last Pain:  Filed Vitals:   12/17/15 1606  PainSc: 8                  Brian Kocourek,W. EDMOND

## 2015-12-17 NOTE — Progress Notes (Signed)
Site area: rt groin 3 fv sheaths Site Prior to Removal:  Level 0 Pressure Applied For:  20 minutes Manual:   yes  Patient Status During Pull:  stable Post Pull Site:  Level  0 Post Pull Instructions Given:  yes Post Pull Pulses Present: yes Dressing Applied:  Small tegaderm Bedrest begins @  1620 Comments:  IV saline locked

## 2015-12-17 NOTE — H&P (Signed)
Primary Care Physician: Lorretta Harp, MD Referring Physician: Dr. Judi Saa Leopoldo Mazzie is a 45 y.o. male with a h/o symptomatic paroxysmal atrial fibrillation. He has had atrial fibrillation for several years. He has had difficulty with tachypalpitations and fatigue. He has tried flecainide without real success. He has not tolerated daily medicine. He has an occasional arrhythmia which he describes as "more aggressive" which is poorly tolerated with presyncope. This is felt to possibly be due to rapidly conducting atrial flutter. He finds that his arrhythmia is worsened by lack of sleep or stress. He has episodes every few months lasting up to 18 hours. He continues to take flecainide as a "pill in pocket" option. He has had prior syncope which is felt to be due to a rapid conduction of his arrhythmia by Dr Graciela Husbands.   He continues to use flecainide as a pill in pocket approach for his afib and flutter. He also has metoprolol to use as well.   Today, he denies symptoms of palpitations, chest pain, shortness of breath, orthopnea, PND, lower extremity edema, dizziness, presyncope, syncope, or neurologic sequela. The patient is tolerating medications without difficulties and is otherwise without complaint today.   Past Medical History  Diagnosis Date  . History of syncope   . Asymptomatic varicose veins   . Atrial fibrillation (HCC)   . Rectal bleed     Colonoscopy age 50 hemorrhoids Buccini   Past Surgical History  Procedure Laterality Date  . Knee arthroscopy    . Varicolectomy  1992  . Cardiac catheterization N/A 10/02/2015    Procedure: Left Heart Cath and Coronary Angiography; Surgeon: Iran Ouch, MD; Location: [facility]; Service: Cardiovascular; Laterality: N/A;    Current Outpatient Prescriptions  Medication Sig Dispense Refill  . metoprolol tartrate (LOPRESSOR) 25 MG tablet Take 0.5 tablets (12.5  mg total) by mouth every 6 (six) hours as needed (palpitations). (Patient not taking: Reported on 11/15/2015) 60 tablet 3  . rivaroxaban (XARELTO) 20 MG TABS tablet Take 1 tablet (20 mg total) by mouth daily with supper. 30 tablet 1   No current facility-administered medications for this encounter.    No Known Allergies  Social History   Social History  . Marital Status: Married    Spouse Name: N/A  . Number of Children: N/A  . Years of Education: N/A   Occupational History  . Not on file.   Social History Main Topics  . Smoking status: Never Smoker   . Smokeless tobacco: Not on file  . Alcohol Use: No  . Drug Use: Not on file  . Sexual Activity: Not on file   Other Topics Concern  . 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 Hypertension    . Hyperlipidemia    . Hearing loss    . Other      arrythmia    ROS- All systems are reviewed and negative except as per the HPI above  Physical Exam:                  Vitasl reviewed GEN- The patient is well  appearing, alert and oriented x 3 today.  Head- normocephalic, atraumatic Eyes- Sclera clear, conjunctiva pink Ears- hearing intact Oropharynx- clear Neck- supple, no JVP Lymph- no cervical lymphadenopathy Lungs- Clear to ausculation bilaterally, normal work of breathing Heart- Regular rate and rhythm, no murmurs, rubs or gallops, PMI not laterally displaced GI- soft, NT, ND, + BS Extremities- no clubbing, cyanosis, or edema MS- no significant deformity or atrophy Skin- no rash or lesion Psych- euthymic mood, full affect Neuro- strength and sensation are intact    Assessment and Plan: 1. Paroxysmal atrial fibrillation/ atrial flutter Therapeutic strategies for afib and atrial flutter including medicine and ablation were discussed in detail with the patient today. Risk, benefits, and alternatives to EP study and radiofrequency ablation were also discussed in detail today. These risks include but are not limited to stroke, bleeding, vascular damage, tamponade, perforation, damage to the esophagus, lungs, and other structures, pulmonary vein stenosis, worsening renal function, and death. The patient understands these risk and wishes to proceed.   He reports compliance with xarelto without interruption for at least 3 weeks.  Hillis Range MD, [facility] 12/17/2015 7:33 AM

## 2015-12-17 NOTE — CV Procedure (Addendum)
Brief TEE Report  Moderate sedation: Versed 4 mg, Fentanyl 87.21mcg, Benadryl 50 mg  LVEF >55% Trivial MR, TR.  Mild PR No LA or LAA thrombus or mass.  See full report for further details.   Vaun Hyndman C. Duke Salvia, MD, [facility]  12/17/2015  8:28 AM

## 2015-12-18 ENCOUNTER — Telehealth: Payer: Self-pay | Admitting: *Deleted

## 2015-12-18 ENCOUNTER — Encounter (HOSPITAL_COMMUNITY): Payer: Self-pay | Admitting: Internal Medicine

## 2015-12-18 DIAGNOSIS — I480 Paroxysmal atrial fibrillation: Secondary | ICD-10-CM

## 2015-12-18 LAB — BASIC METABOLIC PANEL
Anion gap: 8 (ref 5–15)
BUN: 12 mg/dL (ref 6–20)
CHLORIDE: 103 mmol/L (ref 101–111)
CO2: 26 mmol/L (ref 22–32)
CREATININE: 1.11 mg/dL (ref 0.61–1.24)
Calcium: 8.5 mg/dL — ABNORMAL LOW (ref 8.9–10.3)
Glucose, Bld: 139 mg/dL — ABNORMAL HIGH (ref 65–99)
POTASSIUM: 3.7 mmol/L (ref 3.5–5.1)
SODIUM: 137 mmol/L (ref 135–145)

## 2015-12-18 MED ORDER — PANTOPRAZOLE SODIUM 40 MG PO TBEC: 40.0000 mg | DELAYED_RELEASE_TABLET | Freq: Every day | ORAL | Status: DC

## 2015-12-18 MED FILL — Dobutamine in Dextrose 5% Inj 4 MG/ML: INTRAVENOUS | Qty: 250 | Status: AC

## 2015-12-18 NOTE — Discharge Summary (Signed)
ELECTROPHYSIOLOGY PROCEDURE DISCHARGE SUMMARY    Patient ID: Gregory Peck,  MRN: [id number], DOB/AGE: 45-Mar-1972 45 y.o.  Admit date: 12/17/2015 Discharge date: 12/18/2015  Primary Care Physician: Lorretta Harp, MD Electrophysiologist: Milford Cage, MD  Primary Discharge Diagnosis:  Paroxysmal atrial fibrillation and atrial flutter status post ablation this admission  Secondary Discharge Diagnosis:  1.  Prior syncope 2.  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  Electrophysiology study and radiofrequency catheter ablation on 12/17/15 by Dr Hillis Range.  This study demonstrated sinus rhythm upon presentation; rotational Angiography reveals a moderate sized left atrium with four separate pulmonary veins without evidence of pulmonary vein stenosis; successful electrical isolation and anatomical encircling of all four pulmonary veins with radiofrequency current; cavo-tricuspid isthmus ablation was performed with complete bidirectional isthmus block achieved; no inducible arrhythmias following ablation both on and off of dobutamine. The patient did have dual AV nodal physiology. As he has not clinically had AVNRT, I felt that it was prudent to not perform slow pathway ablation today; no early apparent complications.    Brief HPI: Gregory Peck is a 45 y.o. male with a history of paroxysmal atrial fibrillation.  They have failed medical therapy with flecainide. Risks, benefits, and alternatives to catheter ablation of atrial fibrillation were reviewed with the patient who wished to proceed.  The patient underwent TEE prior to the procedure which demonstrated normal LV function and no LAA thrombus.    Hospital Course:  The patient was admitted and underwent EPS/RFCA of atrial fibrillation with details as outlined above.  They were monitored on telemetry overnight which demonstrated sinus rhythm.  Groin was without complication on the day  of discharge.  The patient was examined and considered to be stable for discharge.  Wound care and restrictions were reviewed with the patient.  The patient will be seen back by Rudi Coco, NP in 4 weeks and Dr Johney Frame in 12 weeks for post ablation follow up.   This patients CHA2DS2-VASc Score and unadjusted Ischemic Stroke Rate (% per year) is equal to 0.2 % stroke rate/year from a score of 0 Above score calculated as 1 point each if present [CHF, HTN, DM, Vascular=MI/PAD/Aortic Plaque, Age if 65-74, or Male] Above score calculated as 2 points each if present [Age > 75, or Stroke/TIA/TE]   Physical Exam: Filed Vitals:   12/17/15 1949 12/17/15 2352 12/18/15 0407 12/18/15 0750  BP: 114/72 106/67 92/65 103/67  Pulse: 92 62 73 77  Temp: 98.7 F (37.1 C) 99.3 F (37.4 C) 99.7 F (37.6 C) 99.6 F (37.6 C)  TempSrc: Oral Oral Oral Oral  Resp: Height:      Weight:      SpO2: 96% 97% 93% 98%    GEN- The patient is well appearing, alert and oriented x 3 today.   HEENT: normocephalic, atraumatic; sclera clear, conjunctiva pink; hearing intact; oropharynx clear; neck supple  Lungs- Clear to ausculation bilaterally, normal work of breathing.  No wheezes, rales, rhonchi Heart- Regular rate and rhythm, no murmurs, rubs or gallops  GI- soft, non-tender, non-distended, bowel sounds present  Extremities- no clubbing, cyanosis, or edema; DP/PT/radial pulses 2+ bilaterally, groin without hematoma/bruit MS- no significant deformity or atrophy Skin- warm and dry, no rash or lesion Psych- euthymic mood, full affect Neuro- strength and sensation are intact   Labs:   Lab Results  Component Value Date   WBC 8.2 12/13/2015   HGB 15.6  12/13/2015   HCT 46.1 12/13/2015   MCV 80.0 12/13/2015   PLT 238 12/13/2015     Recent Labs Lab 12/18/15 0345  NA 137  K 3.7  CL 103  CO2 26  BUN 12  CREATININE 1.11  CALCIUM 8.5*  GLUCOSE 139*     Discharge Medications:    Medication  List    TAKE these medications        metoprolol tartrate 25 MG tablet  Commonly known as:  LOPRESSOR  Take 0.5 tablets (12.5 mg total) by mouth every 6 (six) hours as needed (palpitations).     pantoprazole 40 MG tablet  Commonly known as:  PROTONIX  Take 1 tablet (40 mg total) by mouth daily.     rivaroxaban 20 MG Tabs tablet  Commonly known as:  XARELTO  Take 1 tablet (20 mg total) by mouth daily with supper.        Disposition:  Discharge Instructions    Diet - low sodium heart healthy    Complete by:  As directed      Discharge instructions    Complete by:  As directed   No driving for 4 days. No lifting over 5 lbs for 1 week. No sexual activity for 1 week. You may return to work in 1 week. Keep procedure site clean & dry. If you notice increased pain, swelling, bleeding or pus, call/return!  You may shower, but no soaking baths/hot tubs/pools for 1 week.   You have an appointment set up with the Atrial Fibrillation Clinic.  Multiple studies have shown that being followed by a dedicated atrial fibrillation clinic in addition to the standard care you receive from your other physicians improves health. We believe that enrollment in the atrial fibrillation clinic will allow [place] to better care for you.   The phone number to the Atrial Fibrillation Clinic is [phone]. The clinic is staffed Monday through Friday from 8:30am to 5pm.  Parking Directions: The clinic is located in the Heart and Vascular Building connected to [facility]. 1)From [address] turn on to [address] and go to the 3rd entrance  (Heart and Vascular entrance) on the right. 2)Look to the right for Heart &Vascular Parking Garage. 3)A code for the entrance is required please call the clinic to receive this.   4)Take the elevators to the 1st floor. Registration is in the room with the glass walls at the end of the hallway.  If you have any trouble parking or locating the clinic, please don't  hesitate to call [phone].     Increase activity slowly    Complete by:  As directed           Follow-up Information    Follow up with [facility] On 01/15/2016.   Specialty:  Cardiology   Why:  at [facility] information:   [address] [id number] Wilhemina Bonito [place] [address] [phone]      Follow up with Hillis Range, MD On 03/23/2016.   Specialty:  Cardiology   Why:  at 11:45AM    Contact information:   [address] [place] [address] [phone]       Duration of Discharge Encounter: Greater than 30 minutes including physician time.  Signed, Gypsy Balsam, NP 12/18/2015 8:27 AM  I have seen, examined the patient, and reviewed the above assessment and plan.  On exam, RRR.  Changes to above are made where necessary.  Co Sign: Hillis Range, MD 12/18/2015 8:27 AM

## 2015-12-18 NOTE — Telephone Encounter (Signed)
Left message on machine for patient to see how he is doing since he came home from the hospital.

## 2015-12-18 NOTE — Progress Notes (Signed)
D/c instructions reviewed with pt and family, copy of instructions given to pt. One script sent into pt's pharmacy by MD, pt made aware. Pt d/c'd via wheelchair with belongings with family, escorted by hospital volunteer.

## 2015-12-19 ENCOUNTER — Other Ambulatory Visit (HOSPITAL_COMMUNITY): Payer: Self-pay | Admitting: *Deleted

## 2015-12-19 MED ORDER — RIVAROXABAN 20 MG PO TABS: 20.0000 mg | ORAL_TABLET | Freq: Every day | ORAL | Status: DC

## 2015-12-20 NOTE — Telephone Encounter (Signed)
Left message on machine for patient to return our call 

## 2015-12-20 NOTE — Telephone Encounter (Signed)
Is this message still needed?

## 2016-01-15 ENCOUNTER — Encounter (HOSPITAL_COMMUNITY): Payer: Self-pay | Admitting: Nurse Practitioner

## 2016-01-15 ENCOUNTER — Ambulatory Visit (HOSPITAL_COMMUNITY)
Admission: RE | Admit: 2016-01-15 | Discharge: 2016-01-15 | Disposition: A | Discharge status: Home / Self Care | Payer: No Typology Code available for payment source | Source: Ambulatory Visit | Attending: Nurse Practitioner | Admitting: Nurse Practitioner

## 2016-01-15 VITALS — BP 116/82 | HR 64 | Ht 79.0 in | Wt 266.0 lb

## 2016-01-15 DIAGNOSIS — Z79899 Other long term (current) drug therapy: Secondary | ICD-10-CM | POA: Insufficient documentation

## 2016-01-15 DIAGNOSIS — I480 Paroxysmal atrial fibrillation: Secondary | ICD-10-CM

## 2016-01-15 DIAGNOSIS — I4892 Unspecified atrial flutter: Secondary | ICD-10-CM | POA: Insufficient documentation

## 2016-01-15 DIAGNOSIS — Z8249 Family history of ischemic heart disease and other diseases of the circulatory system: Secondary | ICD-10-CM | POA: Insufficient documentation

## 2016-01-15 DIAGNOSIS — I8393 Asymptomatic varicose veins of bilateral lower extremities: Secondary | ICD-10-CM | POA: Insufficient documentation

## 2016-01-15 DIAGNOSIS — Z7902 Long term (current) use of antithrombotics/antiplatelets: Secondary | ICD-10-CM | POA: Insufficient documentation

## 2016-01-15 DIAGNOSIS — I4891 Unspecified atrial fibrillation: Secondary | ICD-10-CM | POA: Insufficient documentation

## 2016-01-15 NOTE — Progress Notes (Signed)
Patient ID: Gregory Peck, male   DOB: 01-Dec-1970, 45 y.o.   MRN: [id number]     Primary Care Physician: Lorretta Harp, MD Referring Physician:Dr. Allred   Gregory Peck is a 45 y.o. male with a h/o afib/flutter that is here for f/u ablation for same 2/7. He reports that initially he had a lot of afib but he had been taking flecainide 300 mg as needed for afib, sometimes several times a week. Afib has been quite for the last two weeks.He denies any rt groin pain or swallowing difficulties.   Today, he denies symptoms of palpitations, chest pain, shortness of breath, orthopnea, PND, lower extremity edema, dizziness, presyncope, syncope, or neurologic sequela. The patient is tolerating medications without difficulties and is otherwise without complaint today.   Past Medical History  Diagnosis Date  . History of syncope   . Asymptomatic varicose veins   . Atrial fibrillation (HCC)   . Rectal bleed     Colonoscopy age 85 hemorrhoids Buccini   Past Surgical History  Procedure Laterality Date  . Knee arthroscopy    . Varicolectomy  1992  . Cardiac catheterization N/A 10/02/2015    Procedure: Left Heart Cath and Coronary Angiography;  Surgeon: Iran Ouch, MD;  Location: [facility] INVASIVE CV LAB;  Service: Cardiovascular;  Laterality: N/A;  . Electrophysiologic study N/A 12/17/2015    Procedure: Atrial Fibrillation Ablation;  Surgeon: Hillis Range, MD;  Location: [facility] INVASIVE CV LAB;  Service: Cardiovascular;  Laterality: N/A;  . Tee without cardioversion N/A 12/17/2015    Procedure: TRANSESOPHAGEAL ECHOCARDIOGRAM (TEE);  Surgeon: Chilton Si, MD;  Location: [facility] ENDOSCOPY;  Service: Cardiovascular;  Laterality: N/A;    Current Outpatient Prescriptions  Medication Sig Dispense Refill  . rivaroxaban (XARELTO) 20 MG TABS tablet Take 1 tablet (20 mg total) by mouth daily with supper. 30 tablet 1  . metoprolol tartrate (LOPRESSOR) 25 MG tablet Take 0.5 tablets (12.5 mg total) by mouth  every 6 (six) hours as needed (palpitations). (Patient not taking: Reported on 01/15/2016) 60 tablet 3  . pantoprazole (PROTONIX) 40 MG tablet Take 1 tablet (40 mg total) by mouth daily. (Patient not taking: Reported on 01/15/2016) 45 tablet 0   No current facility-administered medications for this encounter.    No Known Allergies  Social History   Social History  . Marital Status: Married    Spouse Name: N/A  . Number of Children: N/A  . Years of Education: N/A   Occupational History  . Not on file.   Social History Main Topics  . Smoking status: Never Smoker   . Smokeless tobacco: Not on file  . Alcohol Use: No  . Drug Use: No  . Sexual Activity: Not on file   Other Topics Concern  . 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 Hypertension    . Hyperlipidemia    . Hearing loss    . Other      arrythmia    ROS- All systems are reviewed and negative except as per the HPI above  Physical Exam: Filed Vitals:   01/15/16 0919  BP:  116/82  Pulse: 64  Height: 6\' 7"  (2.007 m)  Weight: 266 lb (120.657 kg)    GEN- The patient is well appearing, alert and oriented x 3 today.   Head- normocephalic, atraumatic Eyes-  Sclera clear, conjunctiva pink Ears- hearing intact Oropharynx- clear Neck- supple, no JVP Lymph- no cervical lymphadenopathy Lungs- Clear to ausculation bilaterally, normal work of breathing Heart- Regular rate and rhythm, no murmurs, rubs or gallops, PMI not laterally displaced GI- soft, NT, ND, + BS Extremities- no clubbing, cyanosis, or edema. Significant varicosities noted of lower extremities.  MS- no significant deformity or atrophy Skin- no rash or lesion Psych- euthymic mood, full affect Neuro-  strength and sensation are intact  EKG- NSR, rightward axis, IRBBB, septal infarct pr int 142 ms, qrs int 96 ms, qtc 398 ms Epic records reviewed  Assessment and Plan: 1. Afib/flutter S/p ablation 2/7 Advised I prefer that he not take his flecainide 300 mg rescue dose more that every 4 days and I prefer to take metoprolol 25 mg dose first and after 20-30 mins still in afib then take flecainide. If he is requiring more often that that, he may needed to be on daily flecainide until further healing takes place.  Continue on xarelto x 3 months but has chadsvasc score of 0 and when he sees Dr. Johney FrameAllred in May, blood thinner will probably be stopped.  2. Significant LE varicosities May require an evaluation with a vein specialist at some point in the future. May be at risk for venous ulcers  as he ages.  F/u with Dr. Johney FrameAllred May 15 at 11:45 as scheduled afib clinic as needed  Elvina SidleDonna C. Matthew Folksarroll, ANP-C Afib Clinic [facility] [address] [place], [place] [address] [phone]

## 2016-03-23 ENCOUNTER — Ambulatory Visit (INDEPENDENT_AMBULATORY_CARE_PROVIDER_SITE_OTHER): Payer: No Typology Code available for payment source | Admitting: Internal Medicine

## 2016-03-23 ENCOUNTER — Encounter: Payer: Self-pay | Admitting: Internal Medicine

## 2016-03-23 VITALS — BP 120/82 | HR 58 | Ht 79.0 in | Wt 269.2 lb

## 2016-03-23 DIAGNOSIS — I480 Paroxysmal atrial fibrillation: Secondary | ICD-10-CM | POA: Diagnosis not present

## 2016-03-23 NOTE — Addendum Note (Signed)
Addended by: Dennis BastLANIER, Lizeth Bencosme F on: 03/23/2016 12:24 PM   Modules accepted: Orders, Medications

## 2016-03-23 NOTE — Patient Instructions (Signed)
Medication Instructions:  Your physician has recommended you make the following change in your medication:  1) Stop Xarelto   Labwork: None ordered   Testing/Procedures: None ordered   Follow-Up: Your physician recommends that you schedule a follow-up appointment in: 3 months with Rudi Cocoonna Carroll, PA and 6 months with Dr Johney FrameAllred   Any Other Special Instructions Will Be Listed Below (If Applicable).     If you need a refill on your cardiac medications before your next appointment, please call your pharmacy.

## 2016-03-23 NOTE — Progress Notes (Signed)
PCP: Lorretta HarpPANOSH,[name], MD Primary EP:  Dr Tito DineKlein  Izrael Remer MachoMarc Peck is a 45 y.o. male who presents today for routine electrophysiology followup.  Since his recent ablation, the patient reports doing very well.  He denies procedure related complications and is pleased with current state.  Today, he denies symptoms of palpitations, chest pain, shortness of breath,  lower extremity edema, dizziness, presyncope, or syncope.  The patient is otherwise without complaint today.   Past Medical History  Diagnosis Date  . History of syncope   . Asymptomatic varicose veins   . Atrial fibrillation (HCC)   . Rectal bleed     Colonoscopy age 89 hemorrhoids Buccini   Past Surgical History  Procedure Laterality Date  . Knee arthroscopy    . Varicolectomy  1992  . Cardiac catheterization N/A 10/02/2015    Procedure: Left Heart Cath and Coronary Angiography;  Surgeon: Iran OuchMuhammad A Arida, MD;  Location: [facility] INVASIVE CV LAB;  Service: Cardiovascular;  Laterality: N/A;  . Electrophysiologic study N/A 12/17/2015    PVI and CTI ablation by Dr Johney FrameAllred  . Tee without cardioversion N/A 12/17/2015    Procedure: TRANSESOPHAGEAL ECHOCARDIOGRAM (TEE);  Surgeon: Chilton Siiffany [place], MD;  [facility];  Service: Cardiovascular;  Laterality: N/A;    ROS- all systems are reviewed and negatives except as per HPI above  Current Outpatient Prescriptions  Medication Sig Dispense Refill  . rivaroxaban (XARELTO) 20 MG TABS tablet Take 1 tablet (20 mg total) by mouth daily with supper. 30 tablet 1   No current facility-administered medications for this visit.    Physical Exam: There were no vitals filed for this visit.  GEN- The patient is well appearing, alert and oriented x 3 today.   Head- normocephalic, atraumatic Eyes-  Sclera clear, conjunctiva pink Ears- hearing intact Oropharynx- clear Lungs- Clear to ausculation bilaterally, normal work of breathing Heart- Regular rate and rhythm, no murmurs, rubs or  gallops, PMI not laterally displaced GI- soft, NT, ND, + BS Extremities- no clubbing, cyanosis, or edema  ekg today reveals sinus rhythm  Assessment and Plan:  1. Atrial fibrillation/ atrial flutter Doing well s/p ablation off of AADs Has flecainide which he could take prn chads2vasc score is 0.  Will therefore stop xarelto at this time  Return to see Lupita LeashDonna in the AF clinic in 3 months I will see again in 6 months  Hillis RangeJames Traveon Louro MD, [facility] 03/23/2016 12:08 PM

## 2016-04-01 ENCOUNTER — Other Ambulatory Visit (HOSPITAL_COMMUNITY): Payer: Self-pay | Admitting: *Deleted

## 2016-04-01 DIAGNOSIS — I8390 Asymptomatic varicose veins of unspecified lower extremity: Secondary | ICD-10-CM

## 2016-05-15 ENCOUNTER — Encounter: Payer: Self-pay | Admitting: Vascular Surgery

## 2016-05-18 ENCOUNTER — Ambulatory Visit (INDEPENDENT_AMBULATORY_CARE_PROVIDER_SITE_OTHER): Payer: No Typology Code available for payment source | Admitting: Vascular Surgery

## 2016-05-18 ENCOUNTER — Encounter: Payer: Self-pay | Admitting: Vascular Surgery

## 2016-05-18 VITALS — BP 120/79 | HR 79 | Temp 97.3°F | Resp 16 | Ht 79.0 in | Wt 269.0 lb

## 2016-05-18 DIAGNOSIS — I83893 Varicose veins of bilateral lower extremities with other complications: Secondary | ICD-10-CM | POA: Diagnosis not present

## 2016-05-18 NOTE — Progress Notes (Signed)
Subjective:     Patient ID: Gregory Peck, male   DOB: 01/03/1971, 45 y.o.   MRN: [id number]  HPI this 45 year old male was referred by Dr. Hillis Range for evaluation of bilateral varicose veins. The patient reports bulging in the left medial calf of prominent veins when he was age 54 with the left leg continually progressing with bulging varicosities in distal edema over the past 25 years. His right leg began a similar course about 10 years ago. He has had no history of bleeding, stasis ulcers, DVT, or thrombophlebitis. He does not elastic compression stockings. He develops heaviness and aching as the day progresses left worse than right. He is on his feet during his work. He had an ablation procedure by Dr. all read earlier this year for chronic A. fib and currently he is in normal sinus rhythm. He was on anticoagulation which is now been discontinued.  Past Medical History  Diagnosis Date  . History of syncope   . Asymptomatic varicose veins   . Atrial fibrillation (HCC)   . Rectal bleed     Colonoscopy age 89 hemorrhoids Buccini  . Varicose veins     Social History  Substance Use Topics  . Smoking status: Never Smoker   . Smokeless tobacco: Never Used  . Alcohol Use: No    Family History  Problem Relation Age of Onset  . Hypertension    . Hyperlipidemia    . Hearing loss    . Other      arrythmia    No Known Allergies   Current outpatient prescriptions:  .  flecainide (TAMBOCOR) 100 MG tablet, Take 100 mg by mouth as directed., Disp: , Rfl: 1 .  triamcinolone ointment (KENALOG) 0.1 %, Apply 1 application topically as directed., Disp: , Rfl: 0  Filed Vitals:   05/18/16 1331  BP: 120/79  Pulse: 79  Temp: 97.3 F (36.3 C)  Resp: 16  Height:  (2.007 m)  Weight: 269 lb (122.018 kg)  SpO2: 97%    Body mass index is 30.29 kg/(m^2).         Review of Systems denies chest pain, dyspnea on exertion, PND, orthopnea. Does have history of arrhythmias. Had  successful ablation in January 2017. All other systems negative and complete review of systems     Objective:   Physical Exam BP 120/79 mmHg  Pulse 79  Temp(Src) 97.3 F (36.3 C)  Resp 16  Ht  (2.007 m)  Wt 269 lb (122.018 kg)  BMI 30.29 kg/m2  SpO2 97%    Gen.-alert and oriented x3 in no apparent distress HEENT normal for age Lungs no rhonchi or wheezing Cardiovascular regular rhythm no murmurs carotid pulses 3+ palpable no bruits audible Abdomen soft nontender no palpable masses Musculoskeletal free of  major deformities Skin clear -no rashes Neurologic normal Lower extremities 3+ femoral and dorsalis pedis pulses palpable bilaterally with no edema on the right 1+ edema on the left Extensive bulging varicosities left medial distal thigh medial calf extending into medial malleolar area. Spider veins around malleolar area. No active ulcer noted. Right leg with bulging varicosities in the medial calf extending down toward the medial malleolus. No active ulcer.  Today I performed a bedside ultrasound-sono site exam which revealed enlarged great saphenous veins bilaterally with gross reflux       Assessment:     Painful varicosities bilateral left greater than right due to gross reflux bilateral great saphenous vein causing symptoms which are affecting patient's  daily living History of A. fib/flutter with successful ablation January 2017 by Dr. Hillis RangeJames Allred    Plan:         #1 long leg elastic compression stockings 20-30 mm gradient #2 elevate legs as much as possible #3 ibuprofen daily on a regular basis for pain #4 return in 3 months-formal venous duplex exam will then be performed for formal recommendation being made at that time. I anticipate the need for laser ablation bilateral great saphenous veins with multiple stab phlebectomy.

## 2016-06-19 ENCOUNTER — Other Ambulatory Visit: Payer: Self-pay | Admitting: *Deleted

## 2016-06-19 DIAGNOSIS — I83893 Varicose veins of bilateral lower extremities with other complications: Secondary | ICD-10-CM

## 2016-06-25 ENCOUNTER — Encounter: Payer: No Typology Code available for payment source | Admitting: Vascular Surgery

## 2016-06-25 ENCOUNTER — Ambulatory Visit (HOSPITAL_COMMUNITY)
Admission: RE | Admit: 2016-06-25 | Discharge: 2016-06-25 | Disposition: A | Discharge status: Home / Self Care | Payer: No Typology Code available for payment source | Source: Ambulatory Visit | Attending: Vascular Surgery | Admitting: Vascular Surgery

## 2016-06-25 DIAGNOSIS — I83893 Varicose veins of bilateral lower extremities with other complications: Secondary | ICD-10-CM

## 2016-07-03 ENCOUNTER — Encounter: Payer: Self-pay | Admitting: Vascular Surgery

## 2016-07-07 ENCOUNTER — Ambulatory Visit (INDEPENDENT_AMBULATORY_CARE_PROVIDER_SITE_OTHER): Payer: No Typology Code available for payment source | Admitting: Vascular Surgery

## 2016-07-07 ENCOUNTER — Encounter: Payer: Self-pay | Admitting: Vascular Surgery

## 2016-07-07 VITALS — BP 128/87 | HR 77 | Temp 97.6°F | Resp 16 | Ht 79.0 in | Wt 265.0 lb

## 2016-07-07 DIAGNOSIS — I83893 Varicose veins of bilateral lower extremities with other complications: Secondary | ICD-10-CM | POA: Insufficient documentation

## 2016-07-07 NOTE — Progress Notes (Signed)
Subjective:     Patient ID: Gregory Peck, male   DOB: [birth year], 45 y.o.   MRN: [id number]  HPI This 45 year old male returns for discussion regarding the results of the recent bilateral ultrasound exam. He has painful varicosities in both lower extremities and was noted to have bilateral edema and gross reflux in bilateral great saphenous veins by my bedside exam with the ultrasound 05/18/2016. He has no history of DVT thrombophlebitis stasis ulcers or bleeding.  Past Medical History:  Diagnosis Date  . Asymptomatic varicose veins   . Atrial fibrillation (HCC)   . History of syncope   . Rectal bleed    Colonoscopy age 45 hemorrhoids Buccini  . Varicose veins     Social History  Substance Use Topics  . Smoking status: Never Smoker  . Smokeless tobacco: Never Used  . Alcohol use No    Family History  Problem Relation Age of Onset  . Hypertension    . Hyperlipidemia    . Hearing loss    . Other      arrythmia    No Known Allergies   Current Outpatient Prescriptions:  .  flecainide (TAMBOCOR) 100 MG tablet, Take 100 mg by mouth as directed., Disp: , Rfl: 1 .  triamcinolone ointment (KENALOG) 0.1 %, Apply 1 application topically as directed., Disp: , Rfl: 0  Vitals:   07/07/16 1359  BP: 128/87  Pulse: 77  Resp: 16  Temp: 97.6 F (36.4 C)  SpO2: 100%  Weight: 265 lb (120.2 kg)  Height: 6\' 7"  (2.007 m)    Body mass index is 29.85 kg/m.         Review of Systems Denies chest pain, dyspnea on exertion, PND, orthopnea, hemoptysis    Objective:   Physical Exam BP 128/87   Pulse 77   Temp 97.6 F (36.4 C)   Resp 16   Ht 6\' 7"  (2.007 m)   Wt 265 lb (120.2 kg)   SpO2 100%   BMI 29.85 kg/m   Gen. well-developed well-nourished male in no apparent distress alert and oriented 3 Lungs no rhonchi or wheezing Both legs with 6 extensive bulging varicosities in distal thigh and medial calf areas with early hyperpigmentation lower third of legs no active  ulcer noted.  Patient had a venous duplex exam performed 06/25/2016 which I reviewed today. He does have gross reflux in bilateral great saphenous systems causing the bulging varicosities as described above with no DVT     Assessment:     Bilateral painful varicosities and swelling with gross reflux bilateral great saphenous veins. Patient is being treated currently with long-leg elastic compression stockings 20-30 millimeter gradient, elevation, and ibuprofen    Plan:     Patient will return following October 10 which will be 3 months from initial evaluation and at that time we will make formal recommendation Will likely need bilateral great saphenous vein laser ablation with bilateral stab phlebectomy-greater than 20 Return approximately October 10

## 2016-07-15 ENCOUNTER — Encounter (HOSPITAL_COMMUNITY): Payer: Self-pay | Admitting: Nurse Practitioner

## 2016-07-15 ENCOUNTER — Ambulatory Visit (HOSPITAL_COMMUNITY)
Admission: RE | Admit: 2016-07-15 | Discharge: 2016-07-15 | Disposition: A | Discharge status: Home / Self Care | Payer: No Typology Code available for payment source | Source: Ambulatory Visit | Attending: Nurse Practitioner | Admitting: Nurse Practitioner

## 2016-07-15 VITALS — BP 110/78 | HR 71 | Ht 79.0 in | Wt 266.0 lb

## 2016-07-15 DIAGNOSIS — I4892 Unspecified atrial flutter: Secondary | ICD-10-CM | POA: Insufficient documentation

## 2016-07-15 DIAGNOSIS — I4891 Unspecified atrial fibrillation: Secondary | ICD-10-CM | POA: Insufficient documentation

## 2016-07-15 DIAGNOSIS — I480 Paroxysmal atrial fibrillation: Secondary | ICD-10-CM | POA: Diagnosis not present

## 2016-07-15 NOTE — Progress Notes (Signed)
Patient ID: Gregory Peck, male   DOB: 02-Mar-1971, 45 y.o.   MRN: [id number]     Primary Care Physician: Lorretta Harp, MD Referring Physician:Dr. Allred   Gregory Peck is a 45 y.o. male with a h/o afib/flutter that is here for f/u ablation performed 2/7. He reports that initially he had a lot of afib that he had been taking flecainide 300 mg as needed. Afib has been quite since right after ablation. He continues to do well. Has pill in pocket flecainide but has not had to take since early April.   Today, he denies symptoms of palpitations, chest pain, shortness of breath, orthopnea, PND, lower extremity edema, dizziness, presyncope, syncope, or neurologic sequela. The patient is tolerating medications without difficulties and is otherwise without complaint today.   Past Medical History:  Diagnosis Date  . Asymptomatic varicose veins   . Atrial fibrillation (HCC)   . History of syncope   . Rectal bleed    Colonoscopy age 63 hemorrhoids Buccini  . Varicose veins    Past Surgical History:  Procedure Laterality Date  . CARDIAC CATHETERIZATION N/A 10/02/2015   Procedure: Left Heart Cath and Coronary Angiography;  Surgeon: Iran Ouch, MD;  Location: [facility] INVASIVE CV LAB;  Service: Cardiovascular;  Laterality: N/A;  . ELECTROPHYSIOLOGIC STUDY N/A 12/17/2015   PVI and CTI ablation by Dr Johney Frame  . KNEE ARTHROSCOPY    . TEE WITHOUT CARDIOVERSION N/A 12/17/2015   Procedure: TRANSESOPHAGEAL ECHOCARDIOGRAM (TEE);  Surgeon: Chilton Si, MD;  Location: [facility] ENDOSCOPY;  Service: Cardiovascular;  Laterality: N/A;  . varicolectomy  1992    Current Outpatient Prescriptions  Medication Sig Dispense Refill  . flecainide (TAMBOCOR) 100 MG tablet Take 100 mg by mouth as directed.  1  . triamcinolone ointment (KENALOG) 0.1 % Apply 1 application topically as directed.  0   No current facility-administered medications for this encounter.     No Known Allergies  Social History    Social History  . Marital status: Married    Spouse name: N/A  . Number of children: N/A  . Years of education: N/A   Occupational History  . Not on file.   Social History Main Topics  . Smoking status: Never Smoker  . Smokeless tobacco: Never Used  . Alcohol use No  . Drug use: No  . Sexual activity: Not on file   Other Topics Concern  . 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 Hypertension    . Hyperlipidemia    . Hearing loss    . Other      arrythmia    ROS- All systems are reviewed and negative except as per the HPI above  Physical Exam: Vitals:   07/15/16 1454  BP: 110/78  Pulse: 71  Weight: 266 lb (120.7 kg)  Height: 6\' 7"  (2.007 m)    GEN- The patient is well appearing, alert and oriented x 3 today.   Head- normocephalic, atraumatic Eyes-  Sclera clear, conjunctiva pink Ears- hearing intact Oropharynx- clear Neck- supple, no JVP Lymph- no cervical lymphadenopathy Lungs- Clear to ausculation bilaterally, normal  work of breathing Heart- Regular rate and rhythm, no murmurs, rubs or gallops, PMI not laterally displaced GI- soft, NT, ND, + BS Extremities- no clubbing, cyanosis, or edema. Significant varicosities noted of lower extremities.  MS- no significant deformity or atrophy Skin- no rash or lesion Psych- euthymic mood, full affect Neuro- strength and sensation are intact  EKG- NSR,LAFB. septal infarct pr int 152 ms, qrs int 106 ms, qtc 408 ms Epic records reviewed  Assessment and Plan: 1. Afib/flutter S/p ablation 2/7 Doing well maintaining SR Has pill in pocket flecainide as needed    F/u with Dr. Johney FrameAllred nov 2017 afib clinic as needed  Elvina SidleDonna C. Matthew Folksarroll, ANP-C Afib Clinic [facility] [address] [place], [place] [address] [phone]

## 2016-08-27 ENCOUNTER — Encounter: Payer: Self-pay | Admitting: Vascular Surgery

## 2016-08-31 ENCOUNTER — Ambulatory Visit (INDEPENDENT_AMBULATORY_CARE_PROVIDER_SITE_OTHER): Payer: No Typology Code available for payment source | Admitting: Vascular Surgery

## 2016-08-31 ENCOUNTER — Encounter: Payer: Self-pay | Admitting: Vascular Surgery

## 2016-08-31 VITALS — BP 122/80 | HR 70 | Temp 98.0°F | Resp 16 | Ht 79.0 in | Wt 265.0 lb

## 2016-08-31 DIAGNOSIS — I83893 Varicose veins of bilateral lower extremities with other complications: Secondary | ICD-10-CM

## 2016-08-31 NOTE — Progress Notes (Signed)
Subjective:     Patient ID: Gregory Peck, male   DOB: 1971/10/26, 45 y.o.   MRN: [id number]  HPI This 45 year old male returns today for continued follow-up regarding his extensive painful varicosities in both legs. He is tried long-leg elastic compression stockings 20-30 millimeter gradient as well as elevation and ibuprofen and continues to have severe pain aching throbbing and burning discomfort throughout the day. He develops swelling in the ankles as the day progresses. 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 This is affecting his daily living and ability to work.  Past Medical History:  Diagnosis Date  . Asymptomatic varicose veins   . Atrial fibrillation (HCC)   . History of syncope   . Rectal bleed    Colonoscopy age 45 hemorrhoids Buccini  . Varicose veins     Social History  Substance Use Topics  . Smoking status: Never Smoker  . Smokeless tobacco: Never Used  . Alcohol use No    Family History  Problem Relation Age of Onset  . Hypertension    . Hyperlipidemia    . Hearing loss    . Other      arrythmia    No Known Allergies   Current Outpatient Prescriptions:  .  flecainide (TAMBOCOR) 100 MG tablet, Take 100 mg by mouth as directed., Disp: , Rfl: 1 .  triamcinolone ointment (KENALOG) 0.1 %, Apply 1 application topically as directed., Disp: , Rfl: 0  Vitals:   08/31/16 1354  BP: 122/80  Pulse: 70  Resp: 16  Temp: 98 F (36.7 C)  SpO2: 100%  Weight: 265 lb (120.2 kg)  Height: 6\' 7"  (2.007 m)    Body mass index is 29.85 kg/m.         Review of Systems Denies chest pain, dyspnea on exertion, PND, orthopnea, hemoptysis. Does have a history of atrial fibrillation had ablation procedure in the past 12 months. He is off chronic anticoagulation at the present time. No history DVT.    Objective:   Physical Exam BP 122/80   Pulse 70   Temp 98 F (36.7 C)   Resp 16   Ht 6\' 7"  (2.007 m)   Wt 265 lb (120.2 kg)   SpO2 100%    BMI 29.85 kg/m   . Gen. well-developed well-nourished male no apparent distress alert and oriented 3 Lungs no rhonchi or wheezing Bilateral lower extremities with extensive bulging varicosities in the medial thigh medial calf down to the medial malleolus on both sides with early hyperpigmentation but no active ulcer distally. 1+ edema bilaterally.     Assessment:     Bilateral painful varicosities due to documented gross reflux and large caliber great saphenous veins bilaterally. Symptoms are resistant to conservative measures including long-leg elastic compression stockings 20-30 millimeter gradient, elevation, and ibuprofen. These are affecting patient's daily living and his ability to work.    Plan:     Patient needs #1 laser ablation left great saphenous vein plus greater than 20 stab phlebectomy followed by #2 laser ablation right great saphenous vein plus greater than 20 stab phlebectomy We will proceed with precertification to perform this in the near future

## 2016-09-02 ENCOUNTER — Other Ambulatory Visit: Payer: Self-pay | Admitting: *Deleted

## 2016-09-02 DIAGNOSIS — I83893 Varicose veins of bilateral lower extremities with other complications: Secondary | ICD-10-CM

## 2016-09-21 ENCOUNTER — Ambulatory Visit (INDEPENDENT_AMBULATORY_CARE_PROVIDER_SITE_OTHER): Payer: No Typology Code available for payment source | Admitting: Internal Medicine

## 2016-09-21 ENCOUNTER — Encounter: Payer: Self-pay | Admitting: Internal Medicine

## 2016-09-21 VITALS — BP 128/84 | HR 74 | Ht 79.0 in | Wt 263.8 lb

## 2016-09-21 DIAGNOSIS — I480 Paroxysmal atrial fibrillation: Secondary | ICD-10-CM

## 2016-09-21 NOTE — Patient Instructions (Addendum)
Medication Instructions:  Your physician recommends that you continue on your current medications as directed. Please refer to the Current Medication list given to you today.   Labwork: None ordered   Testing/Procedures: None ordered   Follow-Up: Your physician wants you to follow-up in: 3 months with Donna Carroll, NP and 6 months with Dr Allred You will receive a reminder letter in the mail two months in advance. If you don't receive a letter, please call our office to schedule the follow-up appointment.   Any Other Special Instructions Will Be Listed Below (If Applicable).     If you need a refill on your cardiac medications before your next appointment, please call your pharmacy.   

## 2016-09-23 ENCOUNTER — Encounter: Payer: Self-pay | Admitting: Vascular Surgery

## 2016-09-27 NOTE — Progress Notes (Signed)
   PCP: Lorretta HarpPANOSH,[name], MD Primary EP:  Dr Tito DineKlein  Gregory Peck is a 45 y.o. male who presents today for routine electrophysiology followup.  Since his last visit, the patient reports doing very well.  He is maintaining sinus rhythm and pleased with results of ablation. Today, he denies symptoms of palpitations, chest pain, shortness of breath,  lower extremity edema, dizziness, presyncope, or syncope.  The patient is otherwise without complaint today.   Past Medical History:  Diagnosis Date  . Asymptomatic varicose veins   . Atrial fibrillation (HCC)   . History of syncope   . Rectal bleed    Colonoscopy age 45 hemorrhoids Buccini  . Varicose veins    Past Surgical History:  Procedure Laterality Date  . CARDIAC CATHETERIZATION N/A 10/02/2015   Procedure: Left Heart Cath and Coronary Angiography;  Surgeon: Iran OuchMuhammad A Arida, MD;  [facility]: [facility];  Service: Cardiovascular;  Laterality: N/A;  . ELECTROPHYSIOLOGIC STUDY N/A 12/17/2015   PVI and CTI ablation by Dr Johney FrameAllred  . KNEE ARTHROSCOPY    . TEE WITHOUT CARDIOVERSION N/A 12/17/2015   Procedure: TRANSESOPHAGEAL ECHOCARDIOGRAM (TEE);  Surgeon: Chilton Siiffany Zwingle, MD;  [facility];  Service: Cardiovascular;  Laterality: N/A;  . varicolectomy  1992    ROS- all systems are reviewed and negatives except as per HPI above  Current Outpatient Prescriptions  Medication Sig Dispense Refill  . flecainide (TAMBOCOR) 100 MG tablet Take 100 mg by mouth as directed.  1  . meloxicam (MOBIC) 15 MG tablet Take 15 mg by mouth daily.  5  . triamcinolone ointment (KENALOG) 0.1 % Apply 1 application topically as directed.  0   No current facility-administered medications for this visit.     Physical Exam: Vitals:   09/21/16 1450  BP: 128/84  Pulse: 74  Weight: 263 lb 12.8 oz (119.7 kg)  Height: 6\' 7"  (2.007 m)    GEN- The patient is well appearing, alert and oriented x 3 today.   Head- normocephalic,  atraumatic Eyes-  Sclera clear, conjunctiva pink Ears- hearing intact Oropharynx- clear Lungs- Clear to ausculation bilaterally, normal work of breathing Heart- Regular rate and rhythm, no murmurs, rubs or gallops, PMI not laterally displaced GI- soft, NT, ND, + BS Extremities- no clubbing, cyanosis, or edema  ekg today reveals sinus rhythm 74 bpm, otherwise normal ekg  Assessment and Plan:  1. Atrial fibrillation/ atrial flutter Doing well s/p ablation off of AADs Has flecainide which he could take prn but has not required chads2vasc score is 0.  Therefore not on anticoagulation currently  Return to see Lupita LeashDonna in the AF clinic in 3 months I will see again in 6 months  Hillis RangeJames Danish Ruffins MD, [facility]

## 2016-10-06 ENCOUNTER — Ambulatory Visit (INDEPENDENT_AMBULATORY_CARE_PROVIDER_SITE_OTHER): Payer: No Typology Code available for payment source | Admitting: Vascular Surgery

## 2016-10-06 VITALS — BP 110/75 | HR 81 | Temp 97.7°F | Resp 18 | Ht 79.0 in | Wt 265.0 lb

## 2016-10-06 DIAGNOSIS — I83892 Varicose veins of left lower extremities with other complications: Secondary | ICD-10-CM

## 2016-10-06 NOTE — Progress Notes (Signed)
Laser Ablation Procedure    Date: 10/06/2016   Gregory Peck DOB:08-30-1971  Consent signed: Yes    Surgeon:  Dr. Quita SkyeJames D. Hart RochesterLawson  Procedure: Laser Ablation: left Greater Saphenous Vein  BP 110/75 (BP Location: Left Arm, Patient Position: Sitting, Cuff Size: Large)   Pulse 81   Temp 97.7 F (36.5 C)   Resp 18   Ht 6\' 7"  (2.007 m)   Wt 265 lb (120.2 kg)   SpO2 97%   BMI 29.85 kg/m   Tumescent Anesthesia: 400 cc 0.9% NaCl with 50 cc Lidocaine HCL with 1% Epi and 15 cc 8.4% NaHCO3  Local Anesthesia: 15 cc Lidocaine HCL and NaHCO3 (ratio 2:1)  Pulsed Mode: 15 watts, 500ms delay, 1.0 duration  Total Energy:1841              Total Pulses:123                Total Time: 2:03    Stab Phlebectomy: >20 Sites: Calf and Ankle  Patient tolerated procedure well  Notes:   Description of Procedure:  After marking the course of the secondary varicosities, the patient was placed on the operating table in the supine position, and the left leg was prepped and draped in sterile fashion.   Local anesthetic was administered and under ultrasound guidance the saphenous vein was accessed with a micro needle and guide wire; then the mirco puncture sheath was placed.  A guide wire was inserted saphenofemoral junction , followed by a 5 french sheath.  The position of the sheath and then the laser fiber below the junction was confirmed using the ultrasound.  Tumescent anesthesia was administered along the course of the saphenous vein using ultrasound guidance. The patient was placed in Trendelenburg position and protective laser glasses were placed on patient and staff, and the laser was fired at 15 watts continuous mode advancing 1-862mm/second for a total of 1841 joules.   For stab phlebectomies, local anesthetic was administered at the previously marked varicosities, and tumescent anesthesia was administered around the vessels.  Greater than 20 stab wounds were made using the tip of an 11 blade. And  using the vein hook, the phlebectomies were performed using a hemostat to avulse the varicosities.  Adequate hemostasis was achieved.     Steri strips were applied to the stab wounds and ABD pads and thigh high compression stockings were applied.  Ace wrap bandages were applied over the phlebectomy sites and at the top of the saphenofemoral junction. Blood loss was less than 15 cc.  The patient ambulated out of the operating room having tolerated the procedure well.

## 2016-10-06 NOTE — Progress Notes (Signed)
Subjective:     Patient ID: Gregory Peck, male   DOB: 12/09/1970, 45 y.o.   MRN: [id number]  HPI This 45 year old male had laser ablation of the left great saphenous vein from the distal thigh to near the saphenofemoral junction plus approximately 30 stab phlebectomy of painful varicosities performed under local tumescent anesthesia. A total of 1840 J of energy was utilized. He tolerated the procedures well.  Review of Systems     Objective:   Physical Exam BP 110/75 (BP Location: Left Arm, Patient Position: Sitting, Cuff Size: Large)   Pulse 81   Temp 97.7 F (36.5 C)   Resp 18   Ht 6\' 7"  (2.007 m)   Wt 265 lb (120.2 kg)   SpO2 97%   BMI 29.85 kg/m       Assessment:     Well-tolerated laser ablation left great saphenous vein +30 stab phlebectomy of painful varicosities performed under local tumescent anesthesia    Plan:     Return in 1 week for venous duplex exam to confirm closure left great saphenous vein We'll then proceed in the near future with similar procedure on contralateral right leg

## 2016-10-07 ENCOUNTER — Encounter: Payer: Self-pay | Admitting: Vascular Surgery

## 2016-10-13 ENCOUNTER — Encounter: Payer: Self-pay | Admitting: Vascular Surgery

## 2016-10-13 ENCOUNTER — Ambulatory Visit (HOSPITAL_COMMUNITY)
Admission: RE | Admit: 2016-10-13 | Discharge: 2016-10-13 | Disposition: A | Discharge status: Home / Self Care | Payer: No Typology Code available for payment source | Source: Ambulatory Visit | Attending: Vascular Surgery | Admitting: Vascular Surgery

## 2016-10-13 ENCOUNTER — Ambulatory Visit (INDEPENDENT_AMBULATORY_CARE_PROVIDER_SITE_OTHER): Payer: Self-pay | Admitting: Vascular Surgery

## 2016-10-13 VITALS — BP 126/77 | HR 84 | Temp 98.0°F

## 2016-10-13 DIAGNOSIS — I83893 Varicose veins of bilateral lower extremities with other complications: Secondary | ICD-10-CM

## 2016-10-13 NOTE — Progress Notes (Signed)
Subjective:     Patient ID: Gregory Peck, male   DOB: 12/24/1970, 45 y.o.   MRN: [id number]  HPI This 45 year old male returns 1 week post-laser ablation left great saphenous vein plus approximately 30 stab phlebectomy of painful varicosities left leg. He has had some mild-to-moderate discomfort in the proximal third of the left thigh with some bruising. He did take his ibuprofen as instructed and where his long-leg elastic compression stocking. He denies any chest pain dyspnea on exertion PND orthopnea or hemoptysis.  Review of Systems     Objective:   Physical Exam BP 126/77 (BP Location: Left Arm, Patient Position: Sitting, Cuff Size: Normal)   Pulse 84   Temp 98 F (36.7 C)   Gen. well-developed well-nourished male no patch stress alert and oriented 3 Left leg with mild to moderate ecchymosis proximal third of thigh with no hematoma palpable. Mild discomfort to deep palpation over great saphenous vein. Stab phlebectomy sites and distal thigh and calf all healing nicely. No distal edema noted. 2+ dorsalis pedis pulse palpable.  Today I ordered a venous duplex exam the left leg which I reviewed and interpreted. There is no DVT. There is total closure of the left great saphenous vein from the distal thigh to near the saphenofemoral junction.     Assessment:     Successful laser ablation left great saphenous vein with multiple stab phlebectomy of painful varicosities    Plan:     Return next week for similar procedure and contralateral right leg

## 2016-10-14 ENCOUNTER — Encounter: Payer: Self-pay | Admitting: Vascular Surgery

## 2016-10-19 ENCOUNTER — Ambulatory Visit (INDEPENDENT_AMBULATORY_CARE_PROVIDER_SITE_OTHER): Payer: No Typology Code available for payment source | Admitting: Vascular Surgery

## 2016-10-19 ENCOUNTER — Encounter: Payer: Self-pay | Admitting: Vascular Surgery

## 2016-10-19 VITALS — BP 109/79 | HR 78 | Temp 97.6°F | Resp 16 | Ht 79.0 in | Wt 265.0 lb

## 2016-10-19 DIAGNOSIS — I83891 Varicose veins of right lower extremities with other complications: Secondary | ICD-10-CM | POA: Diagnosis not present

## 2016-10-19 NOTE — Progress Notes (Signed)
Laser Ablation Procedure    Date: 10/19/2016   Gregory HoseMonte [name] DOB:[birth year]  Consent signed: Yes    Surgeon:  Dr. Quita SkyeJames D. Hart RochesterLawson  Procedure: Laser Ablation: right Greater Saphenous Vein  BP 109/79   Pulse 78   Temp 97.6 F (36.4 C)   Resp 16   Ht 6\' 7"  (2.007 m)   Wt 265 lb (120.2 kg)   SpO2 100%   BMI 29.85 kg/m   Tumescent Anesthesia: 400 cc 0.9% NaCl with 50 cc Lidocaine HCL with 1% Epi and 15 cc 8.4% NaHCO3  Local Anesthesia: 7 cc Lidocaine HCL and NaHCO3 (ratio 2:1)  Pulsed Mode: 15 watts, 500ms delay, 1.0 duration  Total Energy: 1729             Total Pulses:  116              Total Time: 1;55    Stab Phlebectomy: >20 Sites: Thigh, Calf and Ankle  Patient tolerated procedure well  Notes:   Description of Procedure:  After marking the course of the secondary varicosities, the patient was placed on the operating table in the supine position, and the right leg was prepped and draped in sterile fashion.   Local anesthetic was administered and under ultrasound guidance the saphenous vein was accessed with a micro needle and guide wire; then the mirco puncture sheath was placed.  A guide wire was inserted saphenofemoral junction , followed by a 5 french sheath.  The position of the sheath and then the laser fiber below the junction was confirmed using the ultrasound.  Tumescent anesthesia was administered along the course of the saphenous vein using ultrasound guidance. The patient was placed in Trendelenburg position and protective laser glasses were placed on patient and staff, and the laser was fired at 15 watts continuous mode advancing 1-532mm/second for a total of 1729 joules.   For stab phlebectomies, local anesthetic was administered at the previously marked varicosities, and tumescent anesthesia was administered around the vessels.  Greater than 20 stab wounds were made using the tip of an 11 blade. And using the vein hook, the phlebectomies were performed using a  hemostat to avulse the varicosities.  Adequate hemostasis was achieved.     Steri strips were applied to the stab wounds and ABD pads and thigh high compression stockings were applied.  Ace wrap bandages were applied over the phlebectomy sites and at the top of the saphenofemoral junction. Blood loss was less than 15 cc.  The patient ambulated out of the operating room having tolerated the procedure well.

## 2016-10-19 NOTE — Progress Notes (Signed)
Subjective:     Patient ID: Gregory Peck, male   DOB: [birth year], 45 y.o.   MRN: [id number]  HPI This 45 year old male had laser ablation of the right great saphenous vein from the distal thigh to near the saphenofemoral junction plus greater than 20 stab phlebectomy of painful varicosities performed under local tumescent anesthesia. A total of 1729 J of energy was utilized. He tolerated the procedure well.  Review of Systems     Objective:   Physical Exam BP 109/79   Pulse 78   Temp 97.6 F (36.4 C)   Resp 16   Ht 6\' 7"  (2.007 m)   Wt 265 lb (120.2 kg)   SpO2 100%   BMI 29.85 kg/m        Assessment:     Well-tolerated laser ablation right great saphenous vein performed under local tumescent anesthesia plus greater than 20 stab phlebectomy of painful varicosities    Plan:     Return in 1 week for venous duplex exam to confirm closure right great saphenous vein and this will complete patient's treatment regimen

## 2016-10-20 ENCOUNTER — Encounter: Payer: Self-pay | Admitting: Vascular Surgery

## 2016-10-21 ENCOUNTER — Encounter: Payer: Self-pay | Admitting: Vascular Surgery

## 2016-10-26 ENCOUNTER — Encounter: Payer: Self-pay | Admitting: Vascular Surgery

## 2016-10-26 ENCOUNTER — Ambulatory Visit (HOSPITAL_COMMUNITY)
Admission: RE | Admit: 2016-10-26 | Discharge: 2016-10-26 | Disposition: A | Discharge status: Home / Self Care | Payer: No Typology Code available for payment source | Source: Ambulatory Visit | Attending: Vascular Surgery | Admitting: Vascular Surgery

## 2016-10-26 ENCOUNTER — Ambulatory Visit: Payer: No Typology Code available for payment source | Admitting: Vascular Surgery

## 2016-10-26 ENCOUNTER — Ambulatory Visit (INDEPENDENT_AMBULATORY_CARE_PROVIDER_SITE_OTHER): Payer: Self-pay | Admitting: Vascular Surgery

## 2016-10-26 ENCOUNTER — Ambulatory Visit (HOSPITAL_COMMUNITY): Admission: RE | Admit: 2016-10-26 | Payer: No Typology Code available for payment source | Source: Ambulatory Visit

## 2016-10-26 VITALS — BP 116/75 | HR 73 | Temp 97.3°F | Resp 18 | Ht 79.0 in | Wt 265.0 lb

## 2016-10-26 DIAGNOSIS — I83893 Varicose veins of bilateral lower extremities with other complications: Secondary | ICD-10-CM

## 2016-10-26 NOTE — Progress Notes (Signed)
Subjective:     Patient ID: Gregory Peck, male   DOB: 04/17/1971, 45 y.o.   MRN: [id number]  HPI This 45 year old male returns 1 week post-laser ablation right great saphenous vein with greater than 20 stab phlebectomy of painful varicosities. He has had some mild discomfort in the medial thigh as one would expect from the ablation. He's had no pain at the stab phlebectomy sites and this had no distal edema. He has worn his compression stocking as instructed and taken ibuprofen. He states the pain level is similar to the contralateral left leg.  Review of Systems     Objective:   Physical Exam BP 116/75 (BP Location: Left Arm, Patient Position: Sitting, Cuff Size: Large)   Pulse 73   Temp 97.3 F (36.3 C)   Resp 18   Ht 6\' 7"  (2.007 m)   Wt 265 lb (120.2 kg)   SpO2 97%   BMI 29.85 kg/m   Gen. well-developed well-nourished male no apparent distress alert and oriented 3 Lungs no rhonchi or wheezing Right leg with mild discomfort to deep palpation in mid thigh and proximally. No ecchymosis noted. No distal edema noted. Stab phlebectomy sites healing well.  Today I ordered a venous duplex exam the right leg which I reviewed and interpreted. There is no DVT. There is total occlusion of the saphenous vein from the proximal calf to the proximal thigh where it is partially occluded up to the saphenofemoral junction    Assessment:     . Successful laser ablation bilateral great saphenous veins with greater than 20 stab phlebectomy painful varicosities bilaterally Some residual small varicosities remain in the posterior calf area which do not require treatment    Plan:     Patient will return to see me on a when necessary basis If he desires sclerotherapy for small varicosities in the posterior calf and later date he will be in touch with us

## 2016-12-22 ENCOUNTER — Encounter (HOSPITAL_COMMUNITY): Payer: Self-pay | Admitting: *Deleted

## 2016-12-22 ENCOUNTER — Inpatient Hospital Stay (HOSPITAL_COMMUNITY)
Admission: RE | Admit: 2016-12-22 | Payer: No Typology Code available for payment source | Source: Ambulatory Visit | Admitting: Nurse Practitioner

## 2017-01-05 ENCOUNTER — Telehealth (HOSPITAL_COMMUNITY): Payer: Self-pay | Admitting: *Deleted

## 2017-01-05 NOTE — Telephone Encounter (Signed)
I cld pt re missed appt on 02/13 for follow up with afib clinic. LMOM

## 2017-01-21 ENCOUNTER — Ambulatory Visit (HOSPITAL_COMMUNITY): Payer: No Typology Code available for payment source | Admitting: Nurse Practitioner

## 2017-01-27 ENCOUNTER — Encounter (HOSPITAL_COMMUNITY): Payer: Self-pay | Admitting: Nurse Practitioner

## 2017-01-27 ENCOUNTER — Ambulatory Visit (HOSPITAL_COMMUNITY)
Admission: RE | Admit: 2017-01-27 | Discharge: 2017-01-27 | Disposition: A | Discharge status: Home / Self Care | Payer: No Typology Code available for payment source | Source: Ambulatory Visit | Attending: Nurse Practitioner | Admitting: Nurse Practitioner

## 2017-01-27 VITALS — BP 118/86 | HR 74 | Ht 79.0 in | Wt 259.0 lb

## 2017-01-27 DIAGNOSIS — I4891 Unspecified atrial fibrillation: Secondary | ICD-10-CM | POA: Diagnosis present

## 2017-01-27 DIAGNOSIS — I4892 Unspecified atrial flutter: Secondary | ICD-10-CM | POA: Insufficient documentation

## 2017-01-27 DIAGNOSIS — I8390 Asymptomatic varicose veins of unspecified lower extremity: Secondary | ICD-10-CM | POA: Diagnosis not present

## 2017-01-27 DIAGNOSIS — I480 Paroxysmal atrial fibrillation: Secondary | ICD-10-CM | POA: Diagnosis not present

## 2017-01-27 DIAGNOSIS — Z9889 Other specified postprocedural states: Secondary | ICD-10-CM | POA: Insufficient documentation

## 2017-01-27 MED ORDER — FLECAINIDE ACETATE 100 MG PO TABS: ORAL_TABLET | ORAL | 1 refills | Status: DC

## 2017-01-27 NOTE — Progress Notes (Signed)
Patient ID: Gregory Peck, male   DOB: 1971/07/04, 46 y.o.   MRN: [id number]     Primary Care Physician: Gregory Harp, MD Referring Physician:Dr. Judi Saa Gregory Peck is a 46 y.o. male with a h/o afib/flutter that is here for f/u ablation performed 12/17/15.  Afib has been quite since. He has not had to use pill in pocket flecainide in over one year. Well pleased with the results of the procedure.  Today, he denies symptoms of palpitations, chest pain, shortness of breath, orthopnea, PND, lower extremity edema, dizziness, presyncope, syncope, or neurologic sequela. The patient is tolerating medications without difficulties and is otherwise without complaint today.   Past Medical History:  Diagnosis Date  . Asymptomatic varicose veins   . Atrial fibrillation (HCC)   . History of syncope   . Rectal bleed    Colonoscopy age 65 hemorrhoids Buccini  . Varicose veins    Past Surgical History:  Procedure Laterality Date  . CARDIAC CATHETERIZATION N/A 10/02/2015   Procedure: Left Heart Cath and Coronary Angiography;  Surgeon: Gregory Ouch, MD;  [facility]: [facility];  Service: Cardiovascular;  Laterality: N/A;  . ELECTROPHYSIOLOGIC STUDY N/A 12/17/2015   PVI and CTI ablation by Dr Gregory Peck  . KNEE ARTHROSCOPY    . TEE WITHOUT CARDIOVERSION N/A 12/17/2015   Procedure: TRANSESOPHAGEAL ECHOCARDIOGRAM (TEE);  Surgeon: Gregory Si, MD;  Location: [facility] ENDOSCOPY;  Service: Cardiovascular;  Laterality: N/A;  . varicolectomy  1992    Current Outpatient Prescriptions  Medication Sig Dispense Refill  . triamcinolone ointment (KENALOG) 0.1 % Apply 1 application topically as directed.  0  . flecainide (TAMBOCOR) 100 MG tablet Take 300mg  by mouth as needed every 4 days for breakthrough afib 6 tablet 1   No current facility-administered medications for this encounter.     No Known Allergies  Social History   Social History  . Marital status: Married    Spouse name: N/A    . Number of children: N/A  . Years of education: N/A   Occupational History  . Not on file.   Social History Main Topics  . Smoking status: Never Smoker  . Smokeless tobacco: Never Used  . Alcohol use No  . Drug use: No  . Sexual activity: Not on file   Other Topics Concern  . 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 Hypertension    . Hyperlipidemia    . Hearing loss    . Other      arrythmia    ROS- All systems are reviewed and negative except as per the HPI above  Physical Exam: Vitals:   01/27/17 1031  BP: 118/86  Pulse: 74  Weight: 259 lb (117.5 kg)  Height: 6\' 7"  (2.007 m)    GEN- The patient is well appearing, alert and oriented x 3 today.   Head- normocephalic, atraumatic Eyes-  Sclera clear, conjunctiva pink Ears- hearing intact Oropharynx- clear Neck- supple, no JVP Lymph- no cervical lymphadenopathy Lungs- Clear to ausculation bilaterally, normal work of breathing Heart- Regular rate and rhythm, no murmurs, rubs or gallops,  PMI not laterally displaced GI- soft, NT, ND, + BS Extremities- no clubbing, cyanosis, or edema. Significant varicosities noted of lower extremities.  MS- no significant deformity or atrophy Skin- no rash or lesion Psych- euthymic mood, full affect Neuro- strength and sensation are intact  EKG- NSR,LAFB. septal infarct pr int 146 ms, qrs int 90 ms, qtc 404 ms Epic records reviewed  Assessment and Plan:  1. Afib/flutter S/p ablation 2/7 Doing well maintaining SR Reviewed pill in pocket flecainide as needed    F/u with Dr. Johney FrameAllred  May as scheduled afib clinic as needed  Gregory Peck, ANP-C [facility] Clinic [facility] [address] [place], [place]  [id number] [phone]

## 2017-05-06 IMAGING — DX DG CHEST 2V
2 series · 2 of 2 positions shown · non-contrast
Comparison: None.

CLINICAL DATA: Shortness of breath, palpitations, and chest pain.

EXAM:
CHEST  2 VIEW

[x chest ap]
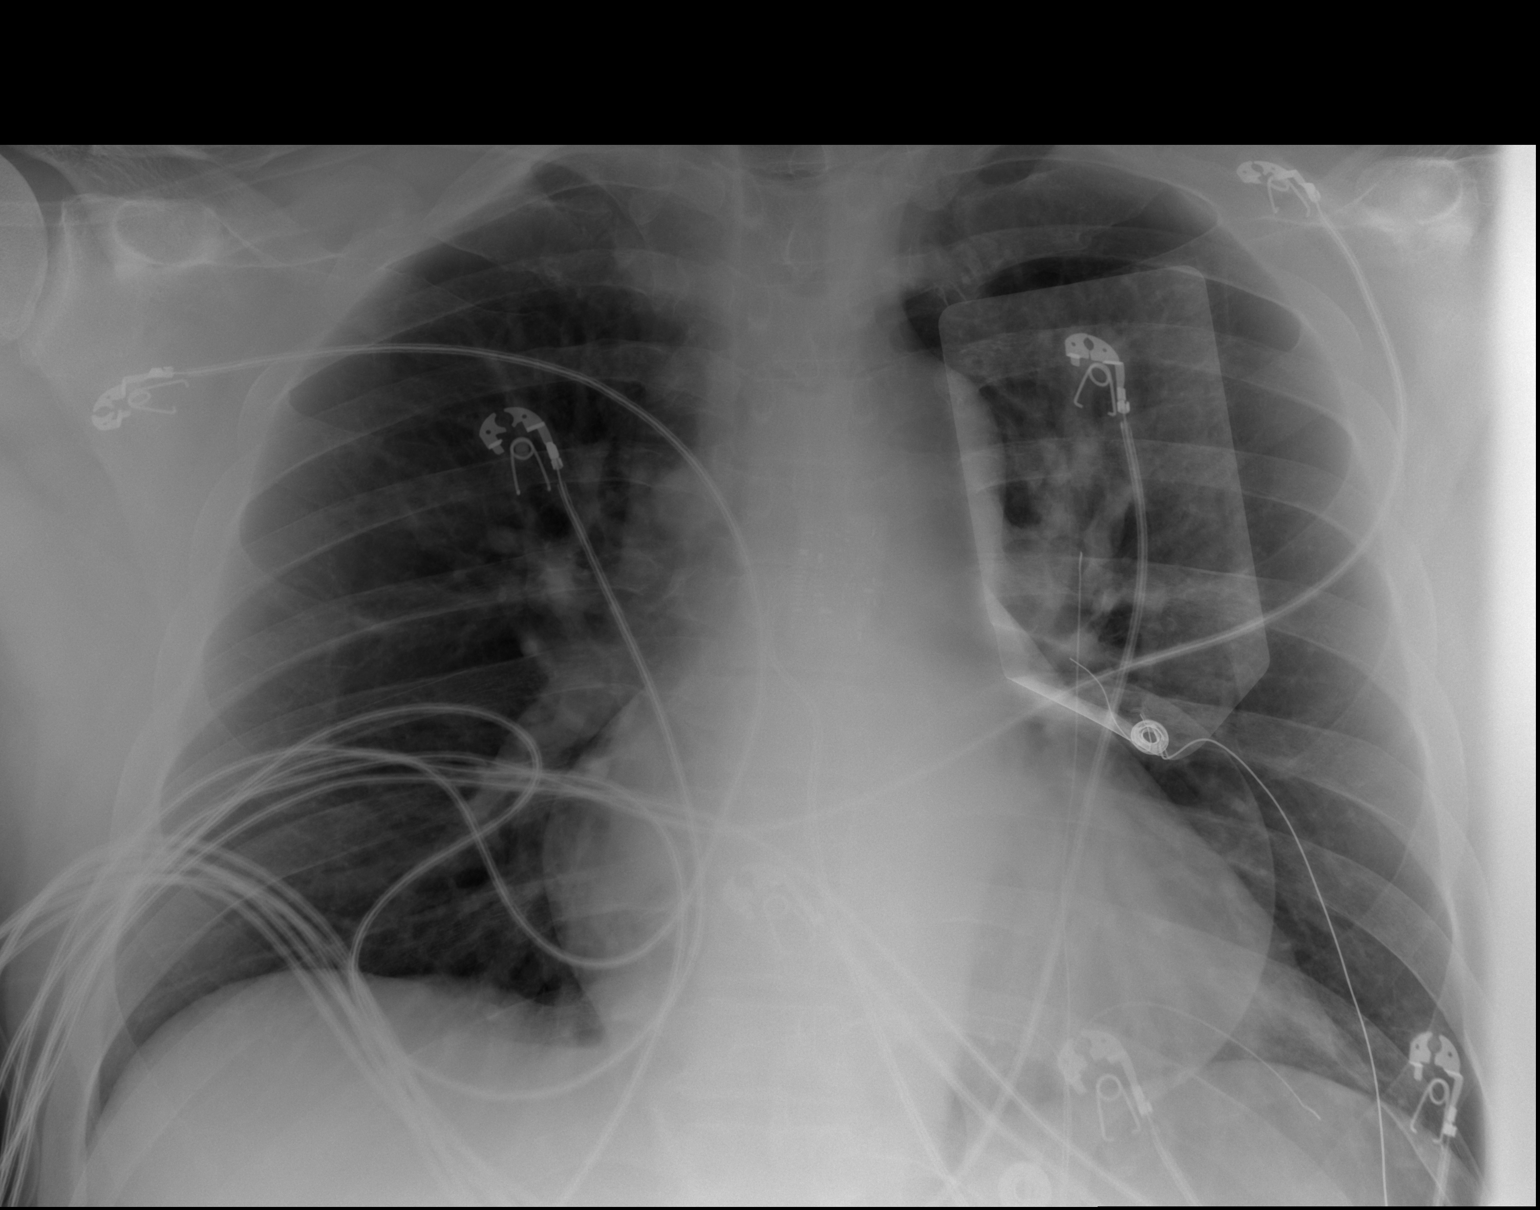

[w chest lat]
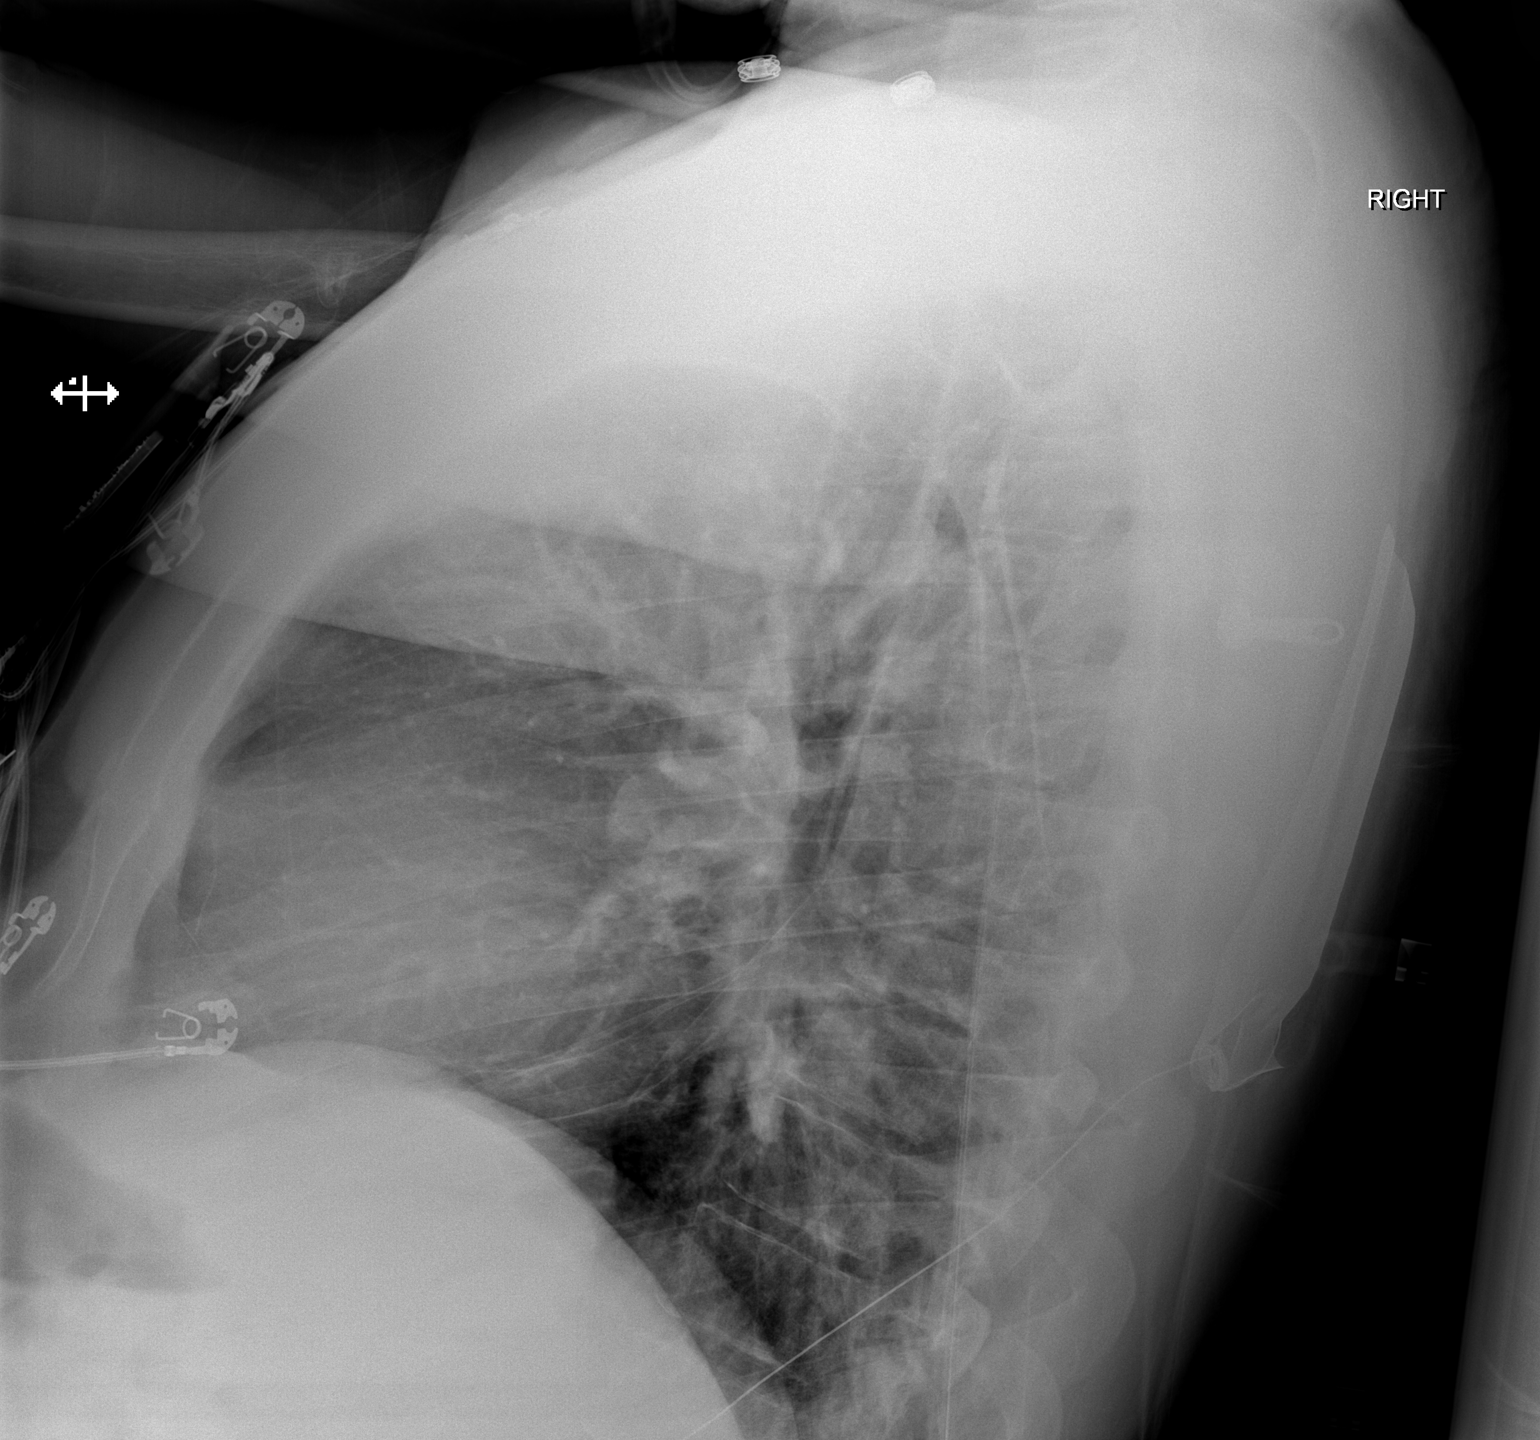

[2 of 2 positions shown; findings below may reference images not displayed]

FINDINGS: The heart size and mediastinal contours are within normal limits.
Both lungs are clear. The visualized skeletal structures are
unremarkable.
IMPRESSION: No active cardiopulmonary disease.

## 2017-07-30 ENCOUNTER — Encounter: Payer: Self-pay | Admitting: Internal Medicine

## 2017-08-04 ENCOUNTER — Other Ambulatory Visit: Payer: Self-pay

## 2017-08-04 ENCOUNTER — Encounter: Payer: Self-pay | Admitting: Internal Medicine

## 2017-08-04 ENCOUNTER — Ambulatory Visit (INDEPENDENT_AMBULATORY_CARE_PROVIDER_SITE_OTHER): Payer: No Typology Code available for payment source | Admitting: Internal Medicine

## 2017-08-04 VITALS — BP 104/78 | HR 64 | Ht 79.0 in | Wt 263.2 lb

## 2017-08-04 DIAGNOSIS — I480 Paroxysmal atrial fibrillation: Secondary | ICD-10-CM | POA: Diagnosis not present

## 2017-08-04 NOTE — Progress Notes (Signed)
   PCP: Gregory Peck  Primary EP: Gregory Peck is a 46 y.o. male who presents today for routine electrophysiology followup.  Since last being seen in our clinic, the patient reports doing very well.  Today, he denies symptoms of palpitations, chest pain, shortness of breath,  lower extremity edema, dizziness, presyncope, or syncope.  The patient is otherwise without complaint today.   Past Medical History:  Diagnosis Date  . Asymptomatic varicose veins   . Atrial fibrillation (HCC)   . History of syncope   . Rectal bleed    Colonoscopy age 79 hemorrhoids Buccini  . Varicose veins    Past Surgical History:  Procedure Laterality Date  . CARDIAC CATHETERIZATION N/A 10/02/2015   Procedure: Left Heart Cath and Coronary Angiography;  Surgeon: Iran Ouch, Peck;  Location: [facility];  Service: Cardiovascular;  Laterality: N/A;  . ELECTROPHYSIOLOGIC STUDY N/A 12/17/2015   PVI and CTI ablation by Gregory Johney Frame  . KNEE ARTHROSCOPY    . TEE WITHOUT CARDIOVERSION N/A 12/17/2015   Procedure: TRANSESOPHAGEAL ECHOCARDIOGRAM (TEE);  Surgeon: Chilton Si, Peck;  Location: [facility] ENDOSCOPY;  Service: Cardiovascular;  Laterality: N/A;  . varicolectomy  1992    ROS- all systems are reviewed and negatives except as per HPI above  Current Outpatient Prescriptions  Medication Sig Dispense Refill  . flecainide (TAMBOCOR) 100 MG tablet Take  by mouth as needed every 4 days for breakthrough afib 6 tablet 1  . triamcinolone ointment (KENALOG) 0.1 % Apply 1 application topically as directed.  0   No current facility-administered medications for this visit.     Physical Exam: Vitals:   08/04/17 1231  BP: 104/78  Pulse: 64  SpO2: 98%  Weight: 263 lb 3.2 oz (119.4 kg)  Height:  (2.007 m)    GEN- The patient is well appearing, alert and oriented x 3 today.   Head- normocephalic, atraumatic Eyes-  Sclera clear, conjunctiva pink Ears- hearing intact Oropharynx-  clear Lungs- Clear to ausculation bilaterally, normal work of breathing Heart- Regular rate and rhythm, no murmurs, rubs or gallops, PMI not laterally displaced GI- soft, NT, ND, + BS Extremities- no clubbing, cyanosis, or edema  EKG tracing ordered today is personally reviewed and shows sinus rhythm 64 bpm, otherwise normal ekg  Assessment and Plan:  1. Atrial fibrillation/ atrial flutter Resolved post ablation off AAD therapy chad2vasc score is 0.  Therefore not on anticoagulation  Return to see me in a year He will contact AF clinic with problems in the interim.  Hillis Range Peck, [facility] 08/04/2017 12:51 PM

## 2017-08-04 NOTE — Patient Instructions (Signed)

## 2018-11-26 NOTE — Progress Notes (Deleted)
No chief complaint on file.   HPI: Patient  Gregory Peck Min  48 y.o. comes in today for Preventive Health Care visit  Last s  een 5 2016      under cards   For  Hx paf   And VV   Health Maintenance  Topic Date Due  . HIV Screening  11/27/1985  . TETANUS/TDAP  04/17/2018  . INFLUENZA VACCINE  06/09/2018   Health Maintenance Review LIFESTYLE:  Exercise:   Tobacco/ETS: Alcohol:  Sugar beverages: Sleep: Drug use: no HH of  Work:  ROS:  GEN/ HEENT: No fever, significant weight changes sweats headaches vision problems hearing changes, CV/ PULM; No chest pain shortness of breath cough, syncope,edema  change in exercise tolerance. GI /GU: No adominal pain, vomiting, change in bowel habits. No blood in the stool. No significant GU symptoms. SKIN/HEME: ,no acute skin rashes suspicious lesions or bleeding. No lymphadenopathy, nodules, masses.  NEURO/ PSYCH:  No neurologic signs such as weakness numbness. No depression anxiety. IMM/ Allergy: No unusual infections.  Allergy .   REST of 12 system review negative except as per HPI   Past Medical History:  Diagnosis Date  . Asymptomatic varicose veins   . Atrial fibrillation (HCC)   . History of syncope   . Rectal bleed    Colonoscopy age 48 hemorrhoids Buccini  . Varicose veins     Past Surgical History:  Procedure Laterality Date  . CARDIAC CATHETERIZATION N/A 10/02/2015   Procedure: Left Heart Cath and Coronary Angiography;  Surgeon: Iran OuchMuhammad A Arida, MD;  Location: [facility];  Service: Cardiovascular;  Laterality: N/A;  . ELECTROPHYSIOLOGIC STUDY N/A 12/17/2015   PVI and CTI ablation by Dr Johney FrameAllred  . KNEE ARTHROSCOPY    . TEE WITHOUT CARDIOVERSION N/A 12/17/2015   Procedure: TRANSESOPHAGEAL ECHOCARDIOGRAM (TEE);  Surgeon: Chilton Siiffany Gulf, MD;  [facility];  Service: Cardiovascular;  Laterality: N/A;  . varicolectomy  1992    Family History  Problem Relation Age of Onset  . Hypertension Unknown   .  Hyperlipidemia Unknown   . Hearing loss Unknown   . Other Unknown        arrythmia    Social History   Socioeconomic History  . Marital status: Married    Spouse name: Not on file  . Number of children: Not on file  . Years of education: Not on file  . Highest education level: Not on file  Occupational History  . Not on file  Social Needs  . Financial resource strain: Not on file  . Food insecurity:    Worry: Not on file    Inability: Not on file  . Transportation needs:    Medical: Not on file    Non-medical: Not on file  Tobacco Use  . Smoking status: Never Smoker  . Smokeless tobacco: Never Used  Substance and Sexual Activity  . Alcohol use: No    Alcohol/week: 0.0 standard drinks  . Drug use: No  . Sexual activity: Not on file  Lifestyle  . Physical activity:    Days per week: Not on file    Minutes per session: Not on file  . Stress: Not on file  Relationships  . Social connections:    Talks on phone: Not on file    Gets together: Not on file    Attends religious service: Not on file    Active member of club or organization: Not on file    Attends meetings of clubs  or organizations: Not on file    Relationship status: Not on file  Other Topics Concern  . 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 flecainide (TAMBOCOR) 100 MG tablet Take 300mg  by mouth as needed every 4 days for breakthrough afib 6 tablet 1  . triamcinolone ointment (KENALOG) 0.1 % Apply 1 application topically as directed.  0   No facility-administered medications prior to visit.      EXAM:  There were no vitals taken for this visit.  There is no height or weight on file to calculate BMI. Wt Readings from Last 3  Encounters:  08/04/17 263 lb 3.2 oz (119.4 kg)  01/27/17 259 lb (117.5 kg)  10/26/16 265 lb (120.2 kg)    Physical Exam: Vital signs reviewed JAS:NKNL is a well-developed well-nourished alert cooperative    who appearsr stated age in no acute distress.  HEENT: normocephalic atraumatic , Eyes: PERRL EOM's full, conjunctiva clear, Nares: paten,t no deformity discharge or tenderness., Ears: no deformity EAC's clear TMs with normal landmarks. Mouth: clear OP, no lesions, edema.  Moist mucous membranes. Dentition in adequate repair. NECK: supple without masses, thyromegaly or bruits. CHEST/PULM:  Clear to auscultation and percussion breath sounds equal no wheeze , rales or rhonchi. No chest wall deformities or tenderness. Breast: normal by inspection . No dimpling, discharge, masses, tenderness or discharge . CV: PMI is nondisplaced, S1 S2 no gallops, murmurs, rubs. Peripheral pulses are full without delay.No JVD .  ABDOMEN: Bowel sounds normal nontender  No guard or rebound, no hepato splenomegal no CVA tenderness.  No hernia. Extremtities:  No clubbing cyanosis or edema, no acute joint swelling or redness no focal atrophy NEURO:  Oriented x3, cranial nerves 3-12 appear to be intact, no obvious focal weakness,gait within normal limits no abnormal reflexes or asymmetrical SKIN: No acute rashes normal turgor, color, no bruising or petechiae. PSYCH: Oriented, good eye contact, no obvious depression anxiety, cognition and judgment appear normal. LN: no cervical axillary inguinal adenopathy  Lab Results  Component Value Date   WBC 8.2 12/13/2015   HGB 15.6 12/13/2015   HCT 46.1 12/13/2015   PLT 238 12/13/2015   GLUCOSE 139 (H) 12/18/2015   CHOL 173 10/02/2015   TRIG 95 10/02/2015   HDL 29 (L) 10/02/2015   LDLDIRECT 141.3 07/01/2011   LDLCALC 125 (H) 10/02/2015   ALT 19 07/01/2011   AST 18 07/01/2011   NA 137 12/18/2015   K 3.7 12/18/2015   CL 103 12/18/2015   CREATININE 1.11 12/18/2015     BUN 12 12/18/2015   CO2 26 12/18/2015   TSH 1.161 10/02/2015   INR 1.16 10/02/2015    BP Readings from Last 3 Encounters:  08/04/17 104/78  01/27/17 118/86  10/26/16 116/75    Lab results reviewed with patient   ASSESSMENT AND PLAN:  Discussed the following assessment and plan:  Visit for preventive health examination  Medication management  Patient Care Team: Madelin Headings, MD as PCP - General Duke Salvia, MD (Cardiology) Hillis Range, MD as Referring Physician (Cardiology) There are no Patient Instructions on file  for this visit.  Neta MendsWanda K. Toluwani Ruder M.D.

## 2018-11-28 ENCOUNTER — Encounter: Payer: No Typology Code available for payment source | Admitting: Internal Medicine

## 2018-12-05 NOTE — Progress Notes (Signed)
Chief Complaint  Patient presents with  . Annual Exam    has a "sore muscle or bruise type sensation" on left side of chest    HPI: Patient  Gregory Peck  48 y.o. comes in today for Preventive Health Care visit   Last seem by me in 2016 for hive  Last cpx 4 16  Has been seen for PAF  carrds Dr Rayann Heman  Has done well since ablation   Sees derm  Surveillance  .    Hx biceps  Torn left this year rx dr Amedeo Plenty   . And since then  Localized pain on certain motions left deep pectoral area  Wants to make sure not cancer.  Just got ok to begin inc exercise   And dr Darnell Level didn't see a problem with exam   .   Health Maintenance  Topic Date Due  . HIV Screening  11/27/1985  . TETANUS/TDAP  04/17/2018  . INFLUENZA VACCINE  06/09/2018   Health Maintenance Review LIFESTYLE:  Exercise:   Light exercise   .  Tobacco/ETS: no Alcohol:    no Sugar beverages: 3-4 per week  Sleep: 6-8 hours  Drug use: no HH of  3  Cat dog gerbils  Work: [employer] eating.  not recnetly   Doesn't get flu shots  Feels jsut for old and sick people   ROS:  See above about the sore area when arises and gets upa certain way  GEN/ HEENT: No fever, significant weight changes sweats headaches vision problems hearing changes, CV/ PULM; No chest pain shortness of breath cough, syncope,edema  change in exercise tolerance. GI /GU: No adominal pain, vomiting, change in bowel habits. No blood in the stool. No significant GU symptoms. SKIN/HEME: ,no acute skin rashes suspicious lesions or bleeding. No lymphadenopathy, nodules, masses.  NEURO/ PSYCH:  No neurologic signs such as weakness numbness. No depression anxiety. IMM/ Allergy: No unusual infections.  Allergy .   REST of 12 system review negative except as per HPI   Past Medical History:  Diagnosis Date  . Asymptomatic varicose veins   . Atrial fibrillation ([place])   . History of syncope   . Rectal bleed    Colonoscopy age 38 hemorrhoids Buccini  .  Varicose veins     Past Surgical History:  Procedure Laterality Date  . CARDIAC CATHETERIZATION N/A 10/02/2015   Procedure: Left Heart Cath and Coronary Angiography;  Surgeon: Wellington Hampshire, MD;  Location: [facility];  Service: Cardiovascular;  Laterality: N/A;  . ELECTROPHYSIOLOGIC STUDY N/A 12/17/2015   PVI and CTI ablation by Dr Rayann Heman  . KNEE ARTHROSCOPY    . TEE WITHOUT CARDIOVERSION N/A 12/17/2015   Procedure: TRANSESOPHAGEAL ECHOCARDIOGRAM (TEE);  Surgeon: Skeet Latch, MD;  Location: [facility];  Service: Cardiovascular;  Laterality: N/A;  . varicolectomy  1992    Family History  Problem Relation Age of Onset  . Hypertension Unknown   . Hyperlipidemia Unknown   . Hearing loss Unknown   . Other Unknown        arrythmia    Social History   Socioeconomic History  . Marital status: Married    Spouse name: Not on file  . Number of children: Not on file  . Years of education: Not on file  . Highest education level: Not on file  Occupational History  . Not on file  Social Needs  . Financial resource strain: Not on file  . Food  insecurity:    Worry: Not on file    Inability: Not on file  . Transportation needs:    Medical: Not on file    Non-medical: Not on file  Tobacco Use  . Smoking status: Never Smoker  . Smokeless tobacco: Never Used  Substance and Sexual Activity  . Alcohol use: No    Alcohol/week: 0.0 standard drinks  . Drug use: No  . Sexual activity: Not on file  Lifestyle  . Physical activity:    Days per week: Not on file    Minutes per session: Not on file  . Stress: Not on file  Relationships  . Social connections:    Talks on phone: Not on file    Gets together: Not on file    Attends religious service: Not on file    Active member of club or organization: Not on file    Attends meetings of clubs or organizations: Not on file    Relationship status: Not on file  Other Topics Concern  . 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 of 3    Outpatient Medications Prior to Visit  Medication Sig Dispense Refill  . flecainide (TAMBOCOR) 100 MG tablet Take '300mg'$  by mouth as needed every 4 days for breakthrough afib 6 tablet 1  . triamcinolone ointment (KENALOG) 0.1 % Apply 1 application topically as directed.  0   No facility-administered medications prior to visit.      EXAM:  BP 118/76 (BP Location: Right Arm, Patient Position: Sitting, Cuff Size: Large)   Pulse 65   Temp 98.8 F (37.1 C) (Oral)   Ht '6\' 6"'$  (1.981 m)   Wt 263 lb 4.8 oz (119.4 kg)   BMI 30.43 kg/m   Body mass index is 30.43 kg/m. Wt Readings from Last 3 Encounters:  12/06/18 263 lb 4.8 oz (119.4 kg)  08/04/17 263 lb 3.2 oz (119.4 kg)  01/27/17 259 lb (117.5 kg)    Physical Exam: Vital signs reviewed OIZ:TIWP is a well-developed well-nourished alert cooperative    who appearsr stated age in no acute distress.  HEENT: normocephalic atraumatic , Eyes: PERRL EOM's full, conjunctiva clear, Nares: paten,t no deformity discharge or tenderness., Ears: no deformity EAC's clear TMs with normal landmarks. Mouth: clear OP, no lesions, edema.  Moist mucous membranes. Dentition in adequate repair. NECK: supple without masses, thyromegaly or bruits. CHEST/PULM:  Clear to auscultation and percussion breath sounds equal no wheeze , rales or rhonchi. No chest wall deformities   Axilla clear left  Localized area of  Spot tenderness that he descibes   No masses  below pectoral muscle  on left  No breast masses  CV: PMI is nondisplaced, S1 S2 no gallops, murmurs, rubs. Peripheral pulses are full without delay.No JVD .  ABDOMEN: Bowel sounds normal nontender  No guard or rebound, no hepato splenomegal no CVA tenderness.   Extremtities:  No clubbing cyanosis or edema, no  acute joint swelling or redness left biceps less than right NEURO:  Oriented x3, cranial nerves 3-12 appear to be intact, no obvious focal weakness,gait within normal limits no abnormal reflexes or asymmetrical SKIN: No acute rashes normal turgor, color, no bruising or  petechiae. PSYCH: Oriented, good eye contact, no obvious depression anxiety, cognition and judgment appear normal. LN: no cervical axillary il adenopathy  Lab Results  Component Value Date   WBC 7.6 12/06/2018   HGB 15.5 12/06/2018   HCT 46.6 12/06/2018   PLT 252.0 12/06/2018   GLUCOSE 84 12/06/2018   CHOL 205 (H) 12/06/2018   TRIG 112.0 12/06/2018   HDL 36.40 (L) 12/06/2018   LDLDIRECT 141.3 07/01/2011   LDLCALC 146 (H) 12/06/2018   ALT 21 12/06/2018   AST 18 12/06/2018   NA 138 12/06/2018   K 4.4 12/06/2018   CL 101 12/06/2018   CREATININE 1.01 12/06/2018   BUN 14 12/06/2018   CO2 31 12/06/2018   TSH 1.57 12/06/2018   INR 1.16 10/02/2015   HGBA1C 6.1 12/06/2018    BP Readings from Last 3 Encounters:  12/06/18 118/76  08/04/17 104/78  01/27/17 118/86    Declines flu  Counseled. About  Benefit more than risk  He will think a bout it   ASSESSMENT AND PLAN:  Discussed the following assessment and plan:  Visit for preventive health examination - Plan: Basic metabolic panel, CBC with Differential/Platelet, Hemoglobin A1c, Hepatic function panel, Lipid panel, TSH  Fasting hyperglycemia - Plan: Basic metabolic panel, CBC with Differential/Platelet, Hemoglobin A1c, Hepatic function panel, Lipid panel, TSH  Low HDL (under 40) - Plan: Basic metabolic panel, CBC with Differential/Platelet, Hemoglobin A1c, Hepatic function panel, Lipid panel, TSH  Chest wall tenderness  Influenza vaccination declined by patient Risk assessment  e lifestyle intervention healthy eating and exercise .  Get weight below  bmi 30  Fu if  Left chest soreness progresses  No evidence of cancer     And do not think  Imagine indicated at  this time  But   reeval if  persistent or progressive  I think prob  related to his injury  .  Patient Care Team: Burnis Medin, MD as PCP - General Caryl Comes Revonda Standard, MD (Cardiology) Thompson Grayer, MD as Referring Physician (Cardiology) Patient Instructions  I don't  See  An abnormality  In the axillary lymph node  Area .  I think this is related to chest wall  Poss injury .   So  increase activity as tolerated and if  persistent or progressive let us re check  .    Will notify you  of labs when available.   Check in lipids etc     Preventing Type 2 Diabetes Mellitus Type 2 diabetes (type 2 diabetes mellitus) is a long-term (chronic) disease that affects blood sugar (glucose) levels. Normally, a hormone called insulin allows glucose to enter cells in the body. The cells use glucose for energy. In type 2 diabetes, one or both of these problems may be present:  The body does not make enough insulin.  The body does not respond properly to insulin that it makes (insulin resistance). Insulin resistance or lack of insulin causes excess glucose to build up in the blood instead of going into cells. As a result, high blood glucose (hyperglycemia) develops, which can cause many complications. Being overweight or obese and having an inactive (sedentary) lifestyle can increase your risk for diabetes. Type 2 diabetes can be delayed or prevented by making certain nutrition and lifestyle changes. What nutrition changes can be made?   Eat healthy meals and snacks regularly. Keep a healthy snack with you for when you get hungry between meals, such as fruit or a handful of nuts.  Eat  lean meats and proteins that are low in saturated fats, such as chicken, fish, egg whites, and beans. Avoid processed meats.  Eat plenty of fruits and vegetables and plenty of grains that have not been processed (whole grains). It is recommended that you eat: ? 1?2 cups of fruit every day. ? 2?3 cups of vegetables every  day. ? 6?8 oz of whole grains every day, such as oats, whole wheat, bulgur, brown rice, quinoa, and millet.  Eat low-fat dairy products, such as milk, yogurt, and cheese.  Eat foods that contain healthy fats, such as nuts, avocado, olive oil, and canola oil.  Drink water throughout the day. Avoid drinks that contain added sugar, such as soda or sweet tea.  Follow instructions from your health care provider about specific eating or drinking restrictions.  Control how much food you eat at a time (portion size). ? Check food labels to find out the serving sizes of foods. ? Use a kitchen scale to weigh amounts of foods.  Saute or steam food instead of frying it. Cook with water or broth instead of oils or butter.  Limit your intake of: ? Salt (sodium). Have no more than 1 tsp (2,400 mg) of sodium a day. If you have heart disease or high blood pressure, have less than ? tsp (1,500 mg) of sodium a day. ? Saturated fat. This is fat that is solid at room temperature, such as butter or fat on meat. What lifestyle changes can be made? Activity   Do moderate-intensity physical activity for at least 30 minutes on at least 5 days of the week, or as much as told by your health care provider.  Ask your health care provider what activities are safe for you. A mix of physical activities may be best, such as walking, swimming, cycling, and strength training.  Try to add physical activity into your day. For example: ? Park in spots that are farther away than usual, so that you walk more. For example, park in a far corner of the parking lot when you go to the office or the grocery store. ? Take a walk during your lunch break. ? Use stairs instead of elevators or escalators. Weight Loss  Lose weight as directed. Your health care provider can determine how much weight loss is best for you and can help you lose weight safely.  If you are overweight or obese, you may be instructed to lose at least 5?7  % of your body weight. Alcohol and Tobacco   Limit alcohol intake to no more than 1 drink a day for nonpregnant women and 2 drinks a day for men. One drink equals 12 oz of beer, 5 oz of wine, or 1 oz of hard liquor.  Do not use any tobacco products, such as cigarettes, chewing tobacco, and e-cigarettes. If you need help quitting, ask your health care provider. Work With [facility] Provider  Have your blood glucose tested regularly, as told by your health care provider.  Discuss your risk factors and how you can reduce your risk for diabetes.  Get screening tests as told by your health care provider. You may have screening tests regularly, especially if you have certain risk factors for type 2 diabetes.  Make an appointment with a diet and nutrition specialist (registered dietitian). A registered dietitian can help you make a healthy eating plan and can help you understand portion sizes and food labels. Why are these changes important?  It is possible to prevent  or delay type 2 diabetes and related health problems by making lifestyle and nutrition changes.  It can be difficult to recognize signs of type 2 diabetes. The best way to avoid possible damage to your body is to take actions to prevent the disease before you develop symptoms. What can happen if changes are not made?  Your blood glucose levels may keep increasing. Having high blood glucose for a long time is dangerous. Too much glucose in your blood can damage your blood vessels, heart, kidneys, nerves, and eyes.  You may develop prediabetes or type 2 diabetes. Type 2 diabetes can lead to many chronic health problems and complications, such as: ? Heart disease. ? Stroke. ? Blindness. ? Kidney disease. ? Depression. ? Poor circulation in the feet and legs, which could lead to surgical removal (amputation) in severe cases. Where to find support  Ask your health care provider to recommend a registered dietitian, diabetes  educator, or weight loss program.  Look for local or online weight loss groups.  Join a gym, fitness club, or outdoor activity group, such as a walking club. Where to find more information To learn more about diabetes and diabetes prevention, visit:  American Diabetes Association (ADA): www.diabetes.[employer] of Diabetes and Digestive and Kidney Diseases: FindSpin.nl To learn more about healthy eating, visit:  The U.S. Department of Agriculture Scientist, research (physical sciences)), Choose My Plate: http://wiley-williams.com/  Office of Disease Prevention and Health Promotion (ODPHP), Dietary Guidelines: SurferLive.at Summary  You can reduce your risk for type 2 diabetes by increasing your physical activity, eating healthy foods, and losing weight as directed.  Talk with your health care provider about your risk for type 2 diabetes. Ask about any blood tests or screening tests that you need to have. This information is not intended to replace advice given to you by your health care provider. Make sure you discuss any questions you have with your health care provider. Document Released: 02/17/2016 Document Revised: 10/07/2017 Document Reviewed: 12/17/2015 Elsevier Interactive Patient Education  2019 [place] Years, Male Preventive care refers to lifestyle choices and visits with your health care provider that can promote health and wellness. What does preventive care include?   A yearly physical exam. This is also called an annual well check.  Dental exams once or twice a year.  Routine eye exams. Ask your health care provider how often you should have your eyes checked.  Personal lifestyle choices, including: ? Daily care of your teeth and gums. ? Regular physical activity. ? Eating a healthy diet. ? Avoiding tobacco and drug use. ? Limiting alcohol use. ? Practicing safe sex. ? Taking low-dose aspirin  every day starting at age 39. What happens during an annual well check? The services and screenings done by your health care provider during your annual well check will depend on your age, overall health, lifestyle risk factors, and family history of disease. Counseling Your health care provider may ask you questions about your:  Alcohol use.  Tobacco use.  Drug use.  Emotional well-being.  Home and relationship well-being.  Sexual activity.  Eating habits.  Work and work Statistician. Screening You may have the following tests or measurements:  Height, weight, and BMI.  Blood pressure.  Lipid and cholesterol levels. These may be checked every 5 years, or more frequently if you are over 74 years old.  Skin check.  Lung cancer screening. You may have this screening every year starting at age 74 if you have  a 30-pack-year history of smoking and currently smoke or have quit within the past 15 years.  Colorectal cancer screening. All adults should have this screening starting at age 20 and continuing until age 38. Your health care provider may recommend screening at age 57. You will have tests every 1-10 years, depending on your results and the type of screening test. People at increased risk should start screening at an earlier age. Screening tests may include: ? Guaiac-based fecal occult blood testing. ? Fecal immunochemical test (FIT). ? Stool DNA test. ? Virtual colonoscopy. ? Sigmoidoscopy. During this test, a flexible tube with a tiny camera (sigmoidoscope) is used to examine your rectum and lower colon. The sigmoidoscope is inserted through your anus into your rectum and lower colon. ? Colonoscopy. During this test, a long, thin, flexible tube with a tiny camera (colonoscope) is used to examine your entire colon and rectum.  Prostate cancer screening. Recommendations will vary depending on your family history and other risks.  Hepatitis C blood test.  Hepatitis B blood  test.  Sexually transmitted disease (STD) testing.  Diabetes screening. This is done by checking your blood sugar (glucose) after you have not eaten for a while (fasting). You may have this done every 1-3 years. Discuss your test results, treatment options, and if necessary, the need for more tests with your health care provider. Vaccines Your health care provider may recommend certain vaccines, such as:  Influenza vaccine. This is recommended every year.  Tetanus, diphtheria, and acellular pertussis (Tdap, Td) vaccine. You may need a Td booster every 10 years.  Varicella vaccine. You may need this if you have not been vaccinated.  Zoster vaccine. You may need this after age 74.  Measles, mumps, and rubella (MMR) vaccine. You may need at least one dose of MMR if you were born in 1957 or later. You may also need a second dose.  Pneumococcal 13-valent conjugate (PCV13) vaccine. You may need this if you have certain conditions and have not been vaccinated.  Pneumococcal polysaccharide (PPSV23) vaccine. You may need one or two doses if you smoke cigarettes or if you have certain conditions.  Meningococcal vaccine. You may need this if you have certain conditions.  Hepatitis A vaccine. You may need this if you have certain conditions or if you travel or work in places where you may be exposed to hepatitis A.  Hepatitis B vaccine. You may need this if you have certain conditions or if you travel or work in places where you may be exposed to hepatitis B.  Haemophilus influenzae type b (Hib) vaccine. You may need this if you have certain risk factors. Talk to your health care provider about which screenings and vaccines you need and how often you need them. This information is not intended to replace advice given to you by your health care provider. Make sure you discuss any questions you have with your health care provider. Document Released: 11/22/2015 Document Revised: 12/16/2017 Document  Reviewed: 08/27/2015 Elsevier Interactive Patient Education  2019 [place] K. Kaileigh Viswanathan M.D.

## 2018-12-06 ENCOUNTER — Ambulatory Visit (INDEPENDENT_AMBULATORY_CARE_PROVIDER_SITE_OTHER): Payer: No Typology Code available for payment source | Admitting: Internal Medicine

## 2018-12-06 ENCOUNTER — Encounter: Payer: Self-pay | Admitting: Internal Medicine

## 2018-12-06 VITALS — BP 118/76 | HR 65 | Temp 98.8°F | Ht 78.0 in | Wt 263.3 lb

## 2018-12-06 DIAGNOSIS — Z0000 Encounter for general adult medical examination without abnormal findings: Secondary | ICD-10-CM | POA: Diagnosis not present

## 2018-12-06 DIAGNOSIS — R0789 Other chest pain: Secondary | ICD-10-CM | POA: Diagnosis not present

## 2018-12-06 DIAGNOSIS — Z2821 Immunization not carried out because of patient refusal: Secondary | ICD-10-CM

## 2018-12-06 DIAGNOSIS — R7301 Impaired fasting glucose: Secondary | ICD-10-CM | POA: Diagnosis not present

## 2018-12-06 DIAGNOSIS — E786 Lipoprotein deficiency: Secondary | ICD-10-CM | POA: Diagnosis not present

## 2018-12-06 LAB — HEMOGLOBIN A1C: HEMOGLOBIN A1C: 6.1 % (ref 4.6–6.5)

## 2018-12-06 LAB — CBC WITH DIFFERENTIAL/PLATELET
BASOS PCT: 0.6 % (ref 0.0–3.0)
Basophils Absolute: 0 10*3/uL (ref 0.0–0.1)
Eosinophils Absolute: 0.1 10*3/uL (ref 0.0–0.7)
Eosinophils Relative: 1 % (ref 0.0–5.0)
HCT: 46.6 % (ref 39.0–52.0)
HEMOGLOBIN: 15.5 g/dL (ref 13.0–17.0)
Lymphocytes Relative: 29.9 % (ref 12.0–46.0)
Lymphs Abs: 2.3 10*3/uL (ref 0.7–4.0)
MCHC: 33.4 g/dL (ref 30.0–36.0)
MCV: 81.8 fl (ref 78.0–100.0)
MONO ABS: 0.9 10*3/uL (ref 0.1–1.0)
Monocytes Relative: 11.7 % (ref 3.0–12.0)
NEUTROS ABS: 4.3 10*3/uL (ref 1.4–7.7)
Neutrophils Relative %: 56.8 % (ref 43.0–77.0)
PLATELETS: 252 10*3/uL (ref 150.0–400.0)
RBC: 5.69 Mil/uL (ref 4.22–5.81)
RDW: 14.7 % (ref 11.5–15.5)
WBC: 7.6 10*3/uL (ref 4.0–10.5)

## 2018-12-06 LAB — LIPID PANEL
CHOLESTEROL: 205 mg/dL — AB (ref 0–200)
HDL: 36.4 mg/dL — ABNORMAL LOW (ref >39.00)
LDL CALC: 146 mg/dL — AB (ref 0–99)
NonHDL: 168.49
TRIGLYCERIDES: 112 mg/dL (ref 0.0–149.0)
Total CHOL/HDL Ratio: 6
VLDL: 22.4 mg/dL (ref 0.0–40.0)

## 2018-12-06 LAB — HEPATIC FUNCTION PANEL
ALT: 21 U/L (ref 0–53)
AST: 18 U/L (ref 0–37)
Albumin: 4.4 g/dL (ref 3.5–5.2)
Alkaline Phosphatase: 59 U/L (ref 39–117)
BILIRUBIN TOTAL: 0.8 mg/dL (ref 0.2–1.2)
Bilirubin, Direct: 0.1 mg/dL (ref 0.0–0.3)
Total Protein: 6.9 g/dL (ref 6.0–8.3)

## 2018-12-06 LAB — BASIC METABOLIC PANEL
BUN: 14 mg/dL (ref 6–23)
CHLORIDE: 101 meq/L (ref 96–112)
CO2: 31 mEq/L (ref 19–32)
Calcium: 9.5 mg/dL (ref 8.4–10.5)
Creatinine, Ser: 1.01 mg/dL (ref 0.40–1.50)
GFR: 78.83 mL/min (ref >60.00)
Glucose, Bld: 84 mg/dL (ref 70–99)
POTASSIUM: 4.4 meq/L (ref 3.5–5.1)
SODIUM: 138 meq/L (ref 135–145)

## 2018-12-06 LAB — TSH: TSH: 1.57 u[IU]/mL (ref 0.35–4.50)

## 2018-12-06 NOTE — Patient Instructions (Addendum)
I don't  See  An abnormality  In the axillary lymph node  Area .  I think this is related to chest wall  Poss injury .   So  increase activity as tolerated and if  persistent or progressive let us re check  .    Will notify you  of labs when available.   Check in lipids etc     Preventing Type 2 Diabetes Mellitus Type 2 diabetes (type 2 diabetes mellitus) is a long-term (chronic) disease that affects blood sugar (glucose) levels. Normally, a hormone called insulin allows glucose to enter cells in the body. The cells use glucose for energy. In type 2 diabetes, one or both of these problems may be present:  The body does not make enough insulin.  The body does not respond properly to insulin that it makes (insulin resistance). Insulin resistance or lack of insulin causes excess glucose to build up in the blood instead of going into cells. As a result, high blood glucose (hyperglycemia) develops, which can cause many complications. Being overweight or obese and having an inactive (sedentary) lifestyle can increase your risk for diabetes. Type 2 diabetes can be delayed or prevented by making certain nutrition and lifestyle changes. What nutrition changes can be made?   Eat healthy meals and snacks regularly. Keep a healthy snack with you for when you get hungry between meals, such as fruit or a handful of nuts.  Eat lean meats and proteins that are low in saturated fats, such as chicken, fish, egg whites, and beans. Avoid processed meats.  Eat plenty of fruits and vegetables and plenty of grains that have not been processed (whole grains). It is recommended that you eat: ? 1?2 cups of fruit every day. ? 2?3 cups of vegetables every day. ? 6?8 oz of whole grains every day, such as oats, whole wheat, bulgur, brown rice, quinoa, and millet.  Eat low-fat dairy products, such as milk, yogurt, and cheese.  Eat foods that contain healthy fats, such as nuts, avocado, olive oil, and canola  oil.  Drink water throughout the day. Avoid drinks that contain added sugar, such as soda or sweet tea.  Follow instructions from your health care provider about specific eating or drinking restrictions.  Control how much food you eat at a time (portion size). ? Check food labels to find out the serving sizes of foods. ? Use a kitchen scale to weigh amounts of foods.  Saute or steam food instead of frying it. Cook with water or broth instead of oils or butter.  Limit your intake of: ? Salt (sodium). Have no more than 1 tsp (2,400 mg) of sodium a day. If you have heart disease or high blood pressure, have less than ? tsp (1,500 mg) of sodium a day. ? Saturated fat. This is fat that is solid at room temperature, such as butter or fat on meat. What lifestyle changes can be made? Activity   Do moderate-intensity physical activity for at least 30 minutes on at least 5 days of the week, or as much as told by your health care provider.  Ask your health care provider what activities are safe for you. A mix of physical activities may be best, such as walking, swimming, cycling, and strength training.  Try to add physical activity into your day. For example: ? Park in spots that are farther away than usual, so that you walk more. For example, park in a far corner of the parking lot when  you go to the office or the grocery store. ? Take a walk during your lunch break. ? Use stairs instead of elevators or escalators. Weight Loss  Lose weight as directed. Your health care provider can determine how much weight loss is best for you and can help you lose weight safely.  If you are overweight or obese, you may be instructed to lose at least 5?7 % of your body weight. Alcohol and Tobacco   Limit alcohol intake to no more than 1 drink a day for nonpregnant women and 2 drinks a day for men. One drink equals 12 oz of beer, 5 oz of wine, or 1 oz of hard liquor.  Do not use any tobacco products,  such as cigarettes, chewing tobacco, and e-cigarettes. If you need help quitting, ask your health care provider. Work With [place] Provider  Have your blood glucose tested regularly, as told by your health care provider.  Discuss your risk factors and how you can reduce your risk for diabetes.  Get screening tests as told by your health care provider. You may have screening tests regularly, especially if you have certain risk factors for type 2 diabetes.  Make an appointment with a diet and nutrition specialist (registered dietitian). A registered dietitian can help you make a healthy eating plan and can help you understand portion sizes and food labels. Why are these changes important?  It is possible to prevent or delay type 2 diabetes and related health problems by making lifestyle and nutrition changes.  It can be difficult to recognize signs of type 2 diabetes. The best way to avoid possible damage to your body is to take actions to prevent the disease before you develop symptoms. What can happen if changes are not made?  Your blood glucose levels may keep increasing. Having high blood glucose for a long time is dangerous. Too much glucose in your blood can damage your blood vessels, heart, kidneys, nerves, and eyes.  You may develop prediabetes or type 2 diabetes. Type 2 diabetes can lead to many chronic health problems and complications, such as: ? Heart disease. ? Stroke. ? Blindness. ? Kidney disease. ? Depression. ? Poor circulation in the feet and legs, which could lead to surgical removal (amputation) in severe cases. Where to find support  Ask your health care provider to recommend a registered dietitian, diabetes educator, or weight loss program.  Look for local or online weight loss groups.  Join a gym, fitness club, or outdoor activity group, such as a walking club. Where to find more information To learn more about diabetes and diabetes prevention,  visit:  American Diabetes Association (ADA): www.diabetes.[employer] of Diabetes and Digestive and Kidney Diseases: FindSpin.nl To learn more about healthy eating, visit:  The U.S. Department of Agriculture Scientist, research (physical sciences)), Choose My Plate: http://wiley-williams.com/  Office of Disease Prevention and Health Promotion (ODPHP), Dietary Guidelines: SurferLive.at Summary  You can reduce your risk for type 2 diabetes by increasing your physical activity, eating healthy foods, and losing weight as directed.  Talk with your health care provider about your risk for type 2 diabetes. Ask about any blood tests or screening tests that you need to have. This information is not intended to replace advice given to you by your health care provider. Make sure you discuss any questions you have with your health care provider. Document Released: 02/17/2016 Document Revised: 10/07/2017 Document Reviewed: 12/17/2015 Elsevier Interactive Patient Education  2019 [place] 40-64  Years, Male Preventive care refers to lifestyle choices and visits with your health care provider that can promote health and wellness. What does preventive care include?   A yearly physical exam. This is also called an annual well check.  Dental exams once or twice a year.  Routine eye exams. Ask your health care provider how often you should have your eyes checked.  Personal lifestyle choices, including: ? Daily care of your teeth and gums. ? Regular physical activity. ? Eating a healthy diet. ? Avoiding tobacco and drug use. ? Limiting alcohol use. ? Practicing safe sex. ? Taking low-dose aspirin every day starting at age 36. What happens during an annual well check? The services and screenings done by your health care provider during your annual well check will depend on your age, overall health, lifestyle risk factors, and family history  of disease. Counseling Your health care provider may ask you questions about your:  Alcohol use.  Tobacco use.  Drug use.  Emotional well-being.  Home and relationship well-being.  Sexual activity.  Eating habits.  Work and work Statistician. Screening You may have the following tests or measurements:  Height, weight, and BMI.  Blood pressure.  Lipid and cholesterol levels. These may be checked every 5 years, or more frequently if you are over 92 years old.  Skin check.  Lung cancer screening. You may have this screening every year starting at age 45 if you have a 30-pack-year history of smoking and currently smoke or have quit within the past 15 years.  Colorectal cancer screening. All adults should have this screening starting at age 30 and continuing until age 17. Your health care provider may recommend screening at age 29. You will have tests every 1-10 years, depending on your results and the type of screening test. People at increased risk should start screening at an earlier age. Screening tests may include: ? Guaiac-based fecal occult blood testing. ? Fecal immunochemical test (FIT). ? Stool DNA test. ? Virtual colonoscopy. ? Sigmoidoscopy. During this test, a flexible tube with a tiny camera (sigmoidoscope) is used to examine your rectum and lower colon. The sigmoidoscope is inserted through your anus into your rectum and lower colon. ? Colonoscopy. During this test, a long, thin, flexible tube with a tiny camera (colonoscope) is used to examine your entire colon and rectum.  Prostate cancer screening. Recommendations will vary depending on your family history and other risks.  Hepatitis C blood test.  Hepatitis B blood test.  Sexually transmitted disease (STD) testing.  Diabetes screening. This is done by checking your blood sugar (glucose) after you have not eaten for a while (fasting). You may have this done every 1-3 years. Discuss your test results,  treatment options, and if necessary, the need for more tests with your health care provider. Vaccines Your health care provider may recommend certain vaccines, such as:  Influenza vaccine. This is recommended every year.  Tetanus, diphtheria, and acellular pertussis (Tdap, Td) vaccine. You may need a Td booster every 10 years.  Varicella vaccine. You may need this if you have not been vaccinated.  Zoster vaccine. You may need this after age 21.  Measles, mumps, and rubella (MMR) vaccine. You may need at least one dose of MMR if you were born in 1957 or later. You may also need a second dose.  Pneumococcal 13-valent conjugate (PCV13) vaccine. You may need this if you have certain conditions and have not been vaccinated.  Pneumococcal polysaccharide (PPSV23) vaccine. You may  need one or two doses if you smoke cigarettes or if you have certain conditions.  Meningococcal vaccine. You may need this if you have certain conditions.  Hepatitis A vaccine. You may need this if you have certain conditions or if you travel or work in places where you may be exposed to hepatitis A.  Hepatitis B vaccine. You may need this if you have certain conditions or if you travel or work in places where you may be exposed to hepatitis B.  Haemophilus influenzae type b (Hib) vaccine. You may need this if you have certain risk factors. Talk to your health care provider about which screenings and vaccines you need and how often you need them. This information is not intended to replace advice given to you by your health care provider. Make sure you discuss any questions you have with your health care provider. Document Released: 11/22/2015 Document Revised: 12/16/2017 Document Reviewed: 08/27/2015 Elsevier Interactive Patient Education  2019 [employer].

## 2019-03-06 ENCOUNTER — Telehealth: Payer: Self-pay | Admitting: Internal Medicine

## 2019-03-06 NOTE — Telephone Encounter (Signed)
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  Inquired if her husband could be tested for virus .  Husband is not having any Sx at all.  Related that Patient would  Have to have Sx before he could be tested.  Encouraged to call back if Sx occurred.

## 2019-03-07 NOTE — Telephone Encounter (Signed)
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

## 2020-04-16 ENCOUNTER — Telehealth: Payer: Self-pay

## 2020-04-16 ENCOUNTER — Telehealth: Payer: Self-pay | Admitting: Internal Medicine

## 2020-04-16 NOTE — Telephone Encounter (Signed)
New Message  Pt's wife called and wanted an explanation to how things are done virtually for video. Wants to know how vitals and everything is done virtually.  Please call to explain

## 2020-04-16 NOTE — Telephone Encounter (Signed)
Spoke with pt about his virtual visit on 04/17/20. Pt stated he did not have any questions at this time. Pt agreed and confirmed his virtual visit.

## 2020-04-16 NOTE — Telephone Encounter (Signed)
Sent My-Chart message

## 2020-04-17 ENCOUNTER — Telehealth: Payer: No Typology Code available for payment source | Admitting: Internal Medicine

## 2020-04-17 ENCOUNTER — Other Ambulatory Visit: Payer: Self-pay

## 2020-06-10 ENCOUNTER — Ambulatory Visit: Payer: No Typology Code available for payment source | Admitting: Internal Medicine

## 2020-06-18 ENCOUNTER — Other Ambulatory Visit: Payer: Self-pay

## 2020-06-18 ENCOUNTER — Encounter: Payer: Self-pay | Admitting: Internal Medicine

## 2020-06-18 ENCOUNTER — Ambulatory Visit (INDEPENDENT_AMBULATORY_CARE_PROVIDER_SITE_OTHER): Payer: No Typology Code available for payment source | Admitting: Internal Medicine

## 2020-06-18 VITALS — BP 108/70 | HR 65 | Ht 78.0 in | Wt 259.0 lb

## 2020-06-18 DIAGNOSIS — I480 Paroxysmal atrial fibrillation: Secondary | ICD-10-CM

## 2020-06-18 DIAGNOSIS — R072 Precordial pain: Secondary | ICD-10-CM

## 2020-06-18 DIAGNOSIS — R0789 Other chest pain: Secondary | ICD-10-CM

## 2020-06-18 DIAGNOSIS — Z0000 Encounter for general adult medical examination without abnormal findings: Secondary | ICD-10-CM | POA: Diagnosis not present

## 2020-06-18 MED ORDER — FLECAINIDE ACETATE 100 MG PO TABS: ORAL_TABLET | ORAL | 1 refills | Status: DC

## 2020-06-18 MED ORDER — METOPROLOL TARTRATE 50 MG PO TABS: ORAL_TABLET | ORAL | 0 refills | Status: DC

## 2020-06-18 NOTE — Patient Instructions (Addendum)
Medication Instructions:  Your physician recommends that you continue on your current medications as directed. Please refer to the Current Medication list given to you today.  Labwork: You will get fasting lipids.   We will get these labs on the same day as your cardiac CT.  Testing/Procedures: Your physician has requested that you have cardiac CT. Cardiac computed tomography (CT) is a painless test that uses an x-ray machine to take clear, detailed pictures of your heart.   Follow-Up: Your physician wants you to follow-up in: one year with Dr. Johney Frame.   You will receive a reminder letter in the mail two months in advance. If you don't receive a letter, please call our office to schedule the follow-up appointment.  Any Other Special Instructions Will Be Listed Below (If Applicable).  If you need a refill on your cardiac medications before your next appointment, please call your pharmacy.

## 2020-06-18 NOTE — Progress Notes (Signed)
   PCP: Madelin Headings, MD   Primary EP: Dr Johney Frame  Gregory Peck is a 49 y.o. male who presents today for routine electrophysiology followup.  Since last being seen in our clinic, the patient reports doing very well.  His afib has been quiescent since ablation.  He has had several episodes of chest pain.  He notices this more prominently with activity. Today, he denies symptoms of palpitations, shortness of breath,  lower extremity edema, dizziness, presyncope, or syncope.  The patient is otherwise without complaint today.   Past Medical History:  Diagnosis Date  . Asymptomatic varicose veins   . Atrial fibrillation (HCC)   . History of syncope   . Rectal bleed    Colonoscopy age 66 hemorrhoids Buccini  . Varicose veins    Past Surgical History:  Procedure Laterality Date  . CARDIAC CATHETERIZATION N/A 10/02/2015   Procedure: Left Heart Cath and Coronary Angiography;  Surgeon: Iran Ouch, MD;  Location: [facility] INVASIVE CV LAB;  Service: Cardiovascular;  Laterality: N/A;  . ELECTROPHYSIOLOGIC STUDY N/A 12/17/2015   PVI and CTI ablation by Dr Johney Frame  . KNEE ARTHROSCOPY    . TEE WITHOUT CARDIOVERSION N/A 12/17/2015   Procedure: TRANSESOPHAGEAL ECHOCARDIOGRAM (TEE);  Surgeon: Chilton Si, MD;  [facility] ENDOSCOPY;  Service: Cardiovascular;  Laterality: N/A;  . varicolectomy  1992    ROS- all systems are reviewed and negatives except as per HPI above  Current Outpatient Medications  Medication Sig Dispense Refill  . flecainide (TAMBOCOR) 100 MG tablet Take 300mg  by mouth as needed every 4 days for breakthrough afib 6 tablet 1  . triamcinolone ointment (KENALOG) 0.1 % Apply 1 application topically as directed.  0   No current facility-administered medications for this visit.    Physical Exam: Vitals:   06/18/20 1625  BP: 108/70  Pulse: 65  SpO2: 99%  Weight: 259 lb (117.5 kg)  Height: 6\' 6"  (1.981 m)    GEN- The patient is obesity appearing, alert and oriented x 3  today.   Head- normocephalic, atraumatic Eyes-  Sclera clear, conjunctiva pink Ears- hearing intact Oropharynx- clear Lungs-   normal work of breathing Heart- Regular rate and rhythm  GI- soft  Extremities- no clubbing, cyanosis, or edema  Wt Readings from Last 3 Encounters:  06/18/20 259 lb (117.5 kg)  12/06/18 263 lb 4.8 oz (119.4 kg)  08/04/17 263 lb 3.2 oz (119.4 kg)    EKG tracing ordered today is personally reviewed and shows sinus rhythm, incomplete rbbb  Assessment and Plan:  1. Atrial fibrillation/ atrial flutter He has done very well post ablation chads2vasc score is 0.  Does not require OAC therapy  2. Chest pain Both typical and atypical features Cath from 2016 reviewed with him at length today He is concerned about CAD We discussed stress testing.  His father in law had a massive MI several days after a normal stress test.  He therefore is clear that he would prefer cardiac CT. Risks and benefits of CT including radiation exposure and dye reaction were discussed in detail today.  He accepts the risks and wishes to proceed. We will therefore proceed with cardiac CT with FFR Also obtain fasting lipids  Risks, benefits and potential toxicities for medications prescribed and/or refilled reviewed with patient today.   Return in a year  08/06/17 MD, [facility] 06/18/2020 4:32 PM

## 2020-08-01 ENCOUNTER — Telehealth (INDEPENDENT_AMBULATORY_CARE_PROVIDER_SITE_OTHER): Payer: No Typology Code available for payment source | Admitting: Internal Medicine

## 2020-08-01 ENCOUNTER — Encounter: Payer: Self-pay | Admitting: Internal Medicine

## 2020-08-01 ENCOUNTER — Other Ambulatory Visit: Payer: Self-pay

## 2020-08-01 VITALS — Ht 78.0 in | Wt 252.0 lb

## 2020-08-01 DIAGNOSIS — R55 Syncope and collapse: Secondary | ICD-10-CM | POA: Diagnosis not present

## 2020-08-01 DIAGNOSIS — T7840XA Allergy, unspecified, initial encounter: Secondary | ICD-10-CM

## 2020-08-01 DIAGNOSIS — I471 Supraventricular tachycardia: Secondary | ICD-10-CM

## 2020-08-01 DIAGNOSIS — Z7189 Other specified counseling: Secondary | ICD-10-CM

## 2020-08-01 DIAGNOSIS — M545 Low back pain, unspecified: Secondary | ICD-10-CM

## 2020-08-01 DIAGNOSIS — Z79899 Other long term (current) drug therapy: Secondary | ICD-10-CM

## 2020-08-01 NOTE — Progress Notes (Signed)
Virtual Visit via Video Note  I connected with@ on 08/01/20 at 11:00 AM EDT by a video enabled telemedicine application and verified that I am speaking with the correct person using two identifiers. Location patient: home Location provider:work \ office Persons participating in the virtual visit: patient, provider and wife  WIth national recommendations  regarding COVID 19 pandemic   video visit is advised over in office visit for this patient.  Patient aware  of the limitations of evaluation and management by telemedicine and  availability of in person appointments. and agreed to proceed.   HPI: Gregory Peck presents for video visit Sept 20  About  130 after lunch cheeseburger felt nausea and broke out itchy rash upper thigh and buttocks and under arms itchy    And headache    So rested   Took claritin  x2  Rash on Tues  light headed and better but  Rash still  Took another Claritin.   eventually went away  .   Then September 21 was in the parking lot bent over and had back spasm  Severe  electric  Sharp pain and  Was able to gt to vehicle  And  Called  911   EMS   said passes out ?  Hand  were tingly and  Hands of feet .   BP 100/70  And ekg nl he states this did not feel like A. fib passing out symptoms and no tachycardia. He eventually felt better and went home and did not go to the hospital not not felt needed. Today he feels normal except for recovering from some back stiffness.  No specific weaknesses in his lower extremities recurrent severe back pain with radiculopathy at this time.  He has had what he calls possible itchy rash reaction to onions but only it seems to be in season spring and fall? To have follow-up evaluation Dr. Johney Frame in care. No labs done since last year. Has not gotten Covid vaccine not sure because of mixed messages does not tend to get flu vaccine. didnt feel like  A fib issues  ROS: See pertinent positives and negatives per HPI. No fever  Swelling face  cough   Past Medical History:  Diagnosis Date  . Asymptomatic varicose veins   . Atrial fibrillation (HCC)   . History of syncope   . Rectal bleed    Colonoscopy age 13 hemorrhoids Buccini  . Varicose veins     Past Surgical History:  Procedure Laterality Date  . CARDIAC CATHETERIZATION N/A 10/02/2015   Procedure: Left Heart Cath and Coronary Angiography;  Surgeon: Iran Ouch, MD;  Location: [facility];  Service: Cardiovascular;  Laterality: N/A;  . ELECTROPHYSIOLOGIC STUDY N/A 12/17/2015   PVI and CTI ablation by Dr Johney Frame  . KNEE ARTHROSCOPY    . TEE WITHOUT CARDIOVERSION N/A 12/17/2015   Procedure: TRANSESOPHAGEAL ECHOCARDIOGRAM (TEE);  Surgeon: Chilton Si, MD;  Location: [facility] ENDOSCOPY;  Service: Cardiovascular;  Laterality: N/A;  . varicolectomy  1992    Family History  Problem Relation Age of Onset  . Hypertension Other   . Hyperlipidemia Other   . Hearing loss Other   . Other Other        arrythmia    Social History   Tobacco Use  . Smoking status: Never Smoker  . Smokeless tobacco: Never Used  Vaping Use  . Vaping Use: Never used  Substance Use Topics  . Alcohol use: No    Alcohol/week: 0.0 standard  drinks  . Drug use: No      Current Outpatient Medications:  .  flecainide (TAMBOCOR) 100 MG tablet, Take 300mg  by mouth as needed every 4 days for breakthrough afib, Disp: 6 tablet, Rfl: 1 .  metoprolol tartrate (LOPRESSOR) 50 MG tablet, Take one tablet by mouth two hours prior to cardiac CT IF heart rate is greater than 55 beats per minute, Disp: 1 tablet, Rfl: 0 .  triamcinolone ointment (KENALOG) 0.1 %, Apply 1 application topically as directed., Disp: , Rfl: 0  EXAM: BP Readings from Last 3 Encounters:  06/18/20 108/70  12/06/18 118/76  08/04/17 104/78    VITALS per patient if applicable:    GENERAL: alert, oriented, appears well and in no acute distress looks well    HEENT: atraumatic, conjunttiva clear, no obvious abnormalities  on inspection of external nose and ears  NECK: normal movements of the head and neck  LUNGS: on inspection no signs of respiratory distress, breathing rate appears normal, no obvious gross SOB, gasping or wheezing  CV: no obvious cyanosis  MS: moves all visible extremities without noticeable abnormality  PSYCH/NEURO: pleasant and cooperative, no obvious depression or anxiety, speech and thought processing grossly intact Lab Results  Component Value Date   WBC 7.6 12/06/2018   HGB 15.5 12/06/2018   HCT 46.6 12/06/2018   PLT 252.0 12/06/2018   GLUCOSE 84 12/06/2018   CHOL 205 (H) 12/06/2018   TRIG 112.0 12/06/2018   HDL 36.40 (L) 12/06/2018   LDLDIRECT 141.3 07/01/2011   LDLCALC 146 (H) 12/06/2018   ALT 21 12/06/2018   AST 18 12/06/2018   NA 138 12/06/2018   K 4.4 12/06/2018   CL 101 12/06/2018   CREATININE 1.01 12/06/2018   BUN 14 12/06/2018   CO2 31 12/06/2018   TSH 1.57 12/06/2018   INR 1.16 10/02/2015   HGBA1C 6.1 12/06/2018    ASSESSMENT AND PLAN:  Discussed the following assessment and plan:    ICD-10-CM   1. Allergic reaction, initial encounter suspected  T78.40XA Basic metabolic panel    CBC with Differential/Platelet    Hemoglobin A1c    Hepatic function panel    Lipid panel    TSH    T4, free    C-reactive protein    Alpha-Gal Panel    Ambulatory referral to Allergy   itchy rash  and nause  ha ligh nead?  2. Acute midline low back pain without sciatica  M54.5 Basic metabolic panel    CBC with Differential/Platelet    Hemoglobin A1c    Hepatic function panel    Lipid panel    TSH    T4, free    C-reactive protein    Alpha-Gal Panel   transient but severe  3. Near syncope  R55 Basic metabolic panel    CBC with Differential/Platelet    Hemoglobin A1c    Hepatic function panel    Lipid panel    TSH    T4, free    C-reactive protein    Alpha-Gal Panel    Ambulatory referral to Allergy  4. Atrial tachycardia (HCC)  I47.1 Basic metabolic panel     CBC with Differential/Platelet    Hemoglobin A1c    Hepatic function panel    Lipid panel    TSH    T4, free    C-reactive protein    Alpha-Gal Panel  5. Medication management  Z79.899 Basic metabolic panel    CBC with Differential/Platelet    Hemoglobin A1c  Hepatic function panel    Lipid panel    TSH    T4, free    C-reactive protein    Alpha-Gal Panel  6. Counseled about COVID-19 virus infection  Z71.89   Suspect concern about allergic reaction more than related to skin versus a mild anaphylactic reaction. Suggest and will refer to allergy check lab work including alpha gal antibodies. Suggest taking Benadryl in future if needed.  Stay hydrated. Suspect the syncope near syncope was related to severe pain possibly hyperventilation but uncertain.  He states it did not feel like an atrial fib problem.  Lab work  At [facility] lab  Then plan follow-up visit in person.  Will share information with Dr. Johney Frame and take his recommendations for cardiology follow-up. Counseled.  About safety of vaccine  Expectant management and discussion of plan and treatment with opportunity to ask questions and all were answered. The patient agreed with the plan and demonstrated an understanding of the instructions.   Advised to call back or seek an in-person evaluation if worsening  or having  further concerns . Return if symptoms worsen or fail to improve i interim, for lab and then in  person.   Berniece Andreas, MD

## 2020-08-19 NOTE — Progress Notes (Signed)
New Patient Note  RE: Gregory HoseMonte [name] MRN: [id number] DOB: [birth year] Date of Office Visit: 08/20/2020  Referring provider: Madelin HeadingsPanosh, Wanda K, MD Primary care provider: Madelin HeadingsPanosh, Wanda K, MD  Chief Complaint: Allergic Reaction  History of Present Illness: I had the pleasure of seeing Gregory DionesMonte Peck for initial evaluation at the [facility] on 08/20/2020. He is a 49 y.o. male, who is referred here by Panosh, Neta MendsWanda K, MD for the evaluation of allergic reaction.  Food:  2-3 weeks ago patient had lunch - cheeseburger with red onions. He felt nauseous, headaches, pruritic rash about 1.5 hour afterwards. He took Claritin which helped with the nausea and headaches. However the rash persisted and the following day he got lightheaded.  He passed out after a back spasms and while calling 911. He was evaluated on the field and had normal EKg and normal vitals. Patient has history of afib s/p ablation.   Patient used to consume red meat prior to this with no issues.   He has history of breaking out in less severe rashes in the fall and winter. He noticed that it usually happens after eating hamburgers or onions. This has been happening over the past 6-7 years. This sometimes does not happen everytime he eats burgers or onions.  Denies any associated cofactors such as exertion, infection, NSAID use, or alcohol consumption. The symptoms lasted for a 2 days. He was evaluated by EMS and did not go to ER. He does not have access to epinephrine autoinjector.  Patient has been avoiding beef and red onions since this episode with no additional reactions. He had regular yellow/white onions with no issues since then.  Patient had multiple tick bites in the past.   Past work up includes: none. PCP ordered alpha-gal and planning to get bloodwork drawn this week.  Dietary History: patient has been eating other foods including milk, eggs, peanut, treenuts, sesame, shellfish, fish, soy, wheat,  meats, fruits and vegetables.  Reviewed images on the phone - consistent with urticarial rash on the legs.   08/01/2020 PCP visit: "Gregory HoseMonte Marc Sookram presents for video visit Sept 20  About  130 after lunch cheeseburger felt nausea and broke out itchy rash upper thigh and buttocks and under arms itchy    And headache    So rested   Took Claritin  x2  Rash on Tues  light headed and better but  Rash still  Took another Claritin.   eventually went away  .   Then September 21 was in the parking lot bent over and had back spasm  Severe  electric  Sharp pain and  Was able to gt to vehicle  And  Called  911   EMS   said passes out ?  Hand  were tingly and  Hands of feet .   BP 100/70  And ekg nl he states this did not feel like A. fib passing out symptoms and no tachycardia. He eventually felt better and went home and did not go to the hospital not not felt needed. Today he feels normal except for recovering from some back stiffness.  No specific weaknesses in his lower extremities recurrent severe back pain with radiculopathy at this time.  He has had what he calls possible itchy rash reaction to onions but only it seems to be in season spring and fall? To have follow-up evaluation Dr. Johney FrameAllred in care. No labs done since last year. Has not gotten Covid vaccine not  sure because of mixed messages does not tend to get flu vaccine. didnt feel like  A fib issues  ROS: See pertinent positives and negatives per HPI. No fever  Swelling face cough "  Assessment and Plan: Gregory Peck is a 49 y.o. male with: Allergy with anaphylaxis due to food, subsequent encounter One episode of nausea, headaches and hive outbreak a 1-2 hours after eating a cheeseburger with red onions. Took Claritin with some benefit but the following day he got lightheaded and passed out. EMS evaluation was unremarkable and was not given any additional medications. Used to eat red meat previously with no issues but noticed breaking out in less  severe rashes possibly after eating burgers/onions. History of multiple tick bites in the past.   Today's skin testing showed: Borderline positive to casein, beef and lamb. Negative to onion. Results given.  Handout given on alpha-gal allergy.  Continue strict avoidance of all red meat - beef, pork, lamb, venison.  Monitor symptoms after you eat dairy products and see if makes you break out.   I have prescribed epinephrine injectable and demonstrated proper use. For mild symptoms you can take over the counter antihistamines such as Benadryl and monitor symptoms closely. If symptoms worsen or if you have severe symptoms including breathing issues, throat closure, significant swelling, whole body hives, severe diarrhea and vomiting, lightheadedness then inject epinephrine and seek immediate medical care afterwards.  Food action plan given.  Get bloodwork for alpha gal.   I don't think onions were causing the rashes.   Other allergic rhinitis Rhinitis symptoms mainly in the spring and fall and takes Claritin and Flonase as needed daily with good benefit.  Today's skin testing was positive to grass, weed, ragweed, trees, mold, dog, horse. Borderline positive to mouse.   Start environmental control measures as below.  May use over the counter antihistamines such as Zyrtec (cetirizine), Claritin (loratadine), Allegra (fexofenadine), or Xyzal (levocetirizine) daily as needed.  May use Flonase (fluticasone) nasal spray 1 spray per nostril twice a day as needed for nasal congestion.   Return in about 6 months (around 02/18/2021).  Meds ordered this encounter  Medications  . EPINEPHrine 0.3 mg/0.3 mL IJ SOAJ injection    Sig: Inject 0.3 mg into the muscle once for 1 dose.    Dispense:  1 each    Refill:  1   Other allergy screening: Asthma: no Rhino conjunctivitis: yes  Some mild rhinitis symptoms mainly in the spring and fall. Takes Claritin and Flonase as needed with good benefit.    Medication allergy: no Hymenoptera allergy: no History of recurrent infections suggestive of immunodeficency: no  Diagnostics: Skin Testing: Environmental allergy panel and select foods.  Positive to grass, weed, ragweed, trees, mold, dog, horse.  Borderline positive to mouse, casein, beef and lamb.  Negative to onion.  Results discussed with patient/family.  Airborne Adult Perc - 08/20/20 1521    Time Antigen Placed 1521    Allergen Manufacturer Waynette Buttery    Location Back    Number of Test 59    Panel 1 Select    1. Control-Buffer 50% Glycerol Negative    2. Control-Histamine 1 mg/ml 2+    3. Albumin saline Negative    4. Bahia 2+    5. French [place] 2+    6. Johnson 2+    7. Kentucky Blue 3+    8. Meadow Fescue 2+    9. Perennial Rye 2+    10. Sweet Vernal 2+    11.  Timothy 4+    12. Cocklebur 2+    13. Burweed Marshelder --   +/-   14. Ragweed, short 3+    15. Ragweed, Giant 2+    16. Plantain,  English 2+    17. Lamb's Quarters Negative    18. Sheep Sorrell Negative    19. Rough Pigweed Negative    20. Montine Circle, Rough --   +/-   21. Mugwort, Common 2+    22. Ash mix Negative    23. Birch mix 2+    24. Beech American 2+    25. Box, Elder Negative    26. Cedar, red Negative    27. Cottonwood, Guinea-Bissau Negative    28. Elm mix Negative    29. Hickory 2+    30. Maple mix Negative    31. Oak, Guinea-Bissau mix 4+    32. Pecan Pollen 2+    33. Pine mix Negative    34. Sycamore Eastern 2+    35. Walnut, Black Pollen Negative    36. Alternaria alternata Negative    37. Cladosporium Herbarum Negative    38. Aspergillus mix Negative    39. Penicillium mix 2+    40. Bipolaris sorokiniana (Helminthosporium) Negative    41. Drechslera spicifera (Curvularia) Negative    42. Mucor plumbeus --   +/-   43. Fusarium moniliforme --   +/-   44. Aureobasidium pullulans (pullulara) --   +/-   45. Rhizopus oryzae Negative    46. Botrytis cinera Negative    47. Epicoccum nigrum  Negative    48. Phoma betae Negative    49. Candida Albicans Negative    50. Trichophyton mentagrophytes Negative    51. Mite, D Farinae  5,000 AU/ml Negative    52. Mite, D Pteronyssinus  5,000 AU/ml Negative    53. Cat Hair 10,000 BAU/ml Negative    54.  Dog Epithelia 2+    55. Mixed Feathers Negative    56. Horse Epithelia 2+    57. Cockroach, German Negative    58. Mouse --   +/-   59. Tobacco Leaf Negative          Food Adult Perc - 08/20/20 1500    Time Antigen Placed 1521    Allergen Manufacturer Waynette Buttery    Location Back    Number of allergen test 14    Panel 2 Select    Control-Histamine 1 mg/ml 2+    1. Peanut Negative    2. Soybean Negative    3. Wheat Negative    4. Sesame Negative    5. Milk, cow Negative    6. Egg White, Chicken Negative    7. Casein Negative    8. Shellfish Mix Negative    9. Fish Mix Negative    10. Cashew Negative    37. Pork Negative    40. Beef --   +/-   41. Lamb --   +/-   49. Onion Negative           Past Medical History: Patient Active Problem List   Diagnosis Date Noted  . Allergy with anaphylaxis due to food, subsequent encounter 08/20/2020  . Varicose veins of bilateral lower extremities with other complications 07/07/2016  . A-fib (HCC) 12/17/2015  . Atrial tachycardia (HCC) 10/02/2015  . NSTEMI (non-ST elevated myocardial infarction) (HCC) 10/02/2015  . Chest pain   . Other allergic rhinitis 01/25/2012  . Acute streptococcal tonsillitis 01/25/2012  . Left buttock pain  07/08/2011  . Encounter for preventive health examination 07/08/2011  . Syncope 06/22/2009  . ARTHROSCOPY, KNEE, HX OF 06/22/2009  . ATRIAL FIBRILLATION 06/13/2009  . ASYMPTOMATIC VARICOSE VEINS 04/17/2008   Past Medical History:  Diagnosis Date  . Asymptomatic varicose veins   . Atrial fibrillation (HCC)   . History of syncope   . Rectal bleed    Colonoscopy age 29 hemorrhoids Buccini  . Varicose veins    Past Surgical History: Past Surgical  History:  Procedure Laterality Date  . ARTHROSCOPIC REPAIR ACL    . CARDIAC CATHETERIZATION N/A 10/02/2015   Procedure: Left Heart Cath and Coronary Angiography;  Surgeon: Iran Ouch, MD;  Location: [facility];  Service: Cardiovascular;  Laterality: N/A;  . ELECTROPHYSIOLOGIC STUDY N/A 12/17/2015   PVI and CTI ablation by Dr Johney Frame  . KNEE ARTHROSCOPY    . TEE WITHOUT CARDIOVERSION N/A 12/17/2015   Procedure: TRANSESOPHAGEAL ECHOCARDIOGRAM (TEE);  Surgeon: Chilton Si, MD;  Location: [facility] ENDOSCOPY;  Service: Cardiovascular;  Laterality: N/A;  . varicolectomy  1992   Medication List:  Current Outpatient Medications  Medication Sig Dispense Refill  . fluticasone (FLONASE) 50 MCG/ACT nasal spray Place 2 sprays into both nostrils daily.    Marland Kitchen loratadine (CLARITIN) 10 MG tablet Take 10 mg by mouth daily.    Marland Kitchen EPINEPHrine 0.3 mg/0.3 mL IJ SOAJ injection Inject 0.3 mg into the muscle once for 1 dose. 1 each 1  . triamcinolone ointment (KENALOG) 0.1 % Apply 1 application topically as directed. (Patient not taking: Reported on 08/20/2020)  0   No current facility-administered medications for this visit.   Allergies: No Known Allergies Social History: Social History   Socioeconomic History  . Marital status: Married    Spouse name: Not on file  . Number of children: Not on file  . Years of education: Not on file  . Highest education level: Not on file  Occupational History  . Not on file  Tobacco Use  . Smoking status: Never Smoker  . Smokeless tobacco: Never Used  Vaping Use  . Vaping Use: Never used  Substance and Sexual Activity  . Alcohol use: No    Alcohol/week: 0.0 standard drinks  . Drug use: No  . Sexual activity: Not on file  Other Topics Concern  . 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 Difficulty of Paying Living Expenses: Not on file  Food Insecurity:   . Worried About Programme researcher, broadcasting/film/video in the Last Year: Not on file  . Ran Out of Food in the Last Year: Not on file  Transportation Needs:   . Lack of Transportation (Medical): Not on file  . Lack of Transportation (Non-Medical): Not on file  Physical Activity:   . Days of Exercise per Week: Not on file  . Minutes of Exercise per Session: Not on file  Stress:   . Feeling of Stress : Not on file  Social Connections:   . Frequency of Communication with Friends and Family: Not on file  . Frequency of Social Gatherings with Friends and Family: Not on file  .  Attends Religious Services: Not on file  . Active Member of Clubs or Organizations: Not on file  . Attends Banker Meetings: Not on file  . Marital Status: Not on file   Lives in a house. Smoking: denies Occupation: Counsellor HistorySurveyor, minerals in the house: no Engineer, civil (consulting) in the family room: no Carpet in the bedroom: no Heating: gas and electric Cooling: central Pet: yes 2 cats x 10+ yrs, 1 dog x 10 yr  Family History: Family History  Problem Relation Age of Onset  . Hypertension Other   . Hyperlipidemia Other   . Hearing loss Other   . Other Other        arrythmia  . Allergic rhinitis Neg Hx   . Angioedema Neg Hx   . Asthma Neg Hx   . Eczema Neg Hx   . Immunodeficiency Neg Hx   . Urticaria Neg Hx    Review of Systems  Constitutional: Negative for appetite change, chills, fever and unexpected weight change.  HENT: Negative for congestion and rhinorrhea.   Eyes: Negative for itching.  Respiratory: Negative for cough, chest tightness, shortness of breath and wheezing.   Cardiovascular: Negative for chest pain.  Gastrointestinal: Negative for abdominal pain.  Genitourinary: Negative for difficulty urinating.  Skin:  Negative for rash.  Allergic/Immunologic: Positive for environmental allergies and food allergies.  Neurological: Negative for headaches.   Objective: BP 130/80   Pulse 62   Temp 97.6 F (36.4 C) (Temporal)   Resp 18   Ht 6\' 7"  (2.007 m)   Wt 253 lb (114.8 kg)   SpO2 97%   BMI 28.50 kg/m  Body mass index is 28.5 kg/m. Physical Exam Vitals and nursing note reviewed.  Constitutional:      Appearance: Normal appearance. He is well-developed.  HENT:     Head: Normocephalic and atraumatic.     Right Ear: Tympanic membrane and external ear normal.     Left Ear: Tympanic membrane and external ear normal.     Nose: Nose normal.     Mouth/Throat:     Mouth: Mucous membranes are moist.     Pharynx: Oropharynx is clear.  Eyes:     Conjunctiva/sclera: Conjunctivae normal.  Cardiovascular:     Rate and Rhythm: Normal rate and regular rhythm.     Heart sounds: Normal heart sounds. No murmur heard.  No friction rub. No gallop.   Pulmonary:     Effort: Pulmonary effort is normal.     Breath sounds: Normal breath sounds. No wheezing, rhonchi or rales.  Musculoskeletal:     Cervical back: Neck supple.  Skin:    General: Skin is warm.     Findings: No rash.  Neurological:     Mental Status: He is alert and oriented to person, place, and time.  Psychiatric:        Behavior: Behavior normal.    The plan was reviewed with the patient/family, and all questions/concerned were addressed.  It was my pleasure to see Corvin today and participate in his care. Please feel free to contact me with any questions or concerns.  Sincerely,  Vanessa Ralphs, DO Allergy & Immunology  [facility] office: [phone] [facility] office: [phone]

## 2020-08-20 ENCOUNTER — Telehealth (HOSPITAL_COMMUNITY): Payer: Self-pay | Admitting: *Deleted

## 2020-08-20 ENCOUNTER — Other Ambulatory Visit: Payer: Self-pay

## 2020-08-20 ENCOUNTER — Ambulatory Visit (INDEPENDENT_AMBULATORY_CARE_PROVIDER_SITE_OTHER): Payer: No Typology Code available for payment source | Admitting: Allergy

## 2020-08-20 ENCOUNTER — Encounter: Payer: Self-pay | Admitting: Allergy

## 2020-08-20 VITALS — BP 130/80 | HR 62 | Temp 97.6°F | Resp 18 | Ht 79.0 in | Wt 253.0 lb

## 2020-08-20 DIAGNOSIS — J3089 Other allergic rhinitis: Secondary | ICD-10-CM | POA: Diagnosis not present

## 2020-08-20 DIAGNOSIS — T7800XD Anaphylactic reaction due to unspecified food, subsequent encounter: Secondary | ICD-10-CM

## 2020-08-20 MED ORDER — EPINEPHRINE 0.3 MG/0.3ML IJ SOAJ: 0.3000 mg | Freq: Once | INTRAMUSCULAR | 1 refills | Status: AC

## 2020-08-20 NOTE — Patient Instructions (Addendum)
Today's skin testing showed:  Positive to grass, weed, ragweed, trees, mold, dog, horse.  Borderline positive to mouse, casein, beef and lamb.  Negative to onion.   Results given.   Food:   Handout given on alpha-gal allergy.  Continue strict avoidance of all red meat - beef, pork, lamb, venison.  Monitor symptoms after you eat dairy products and see if makes you break out.   I have prescribed epinephrine injectable and demonstrated proper use. For mild symptoms you can take over the counter antihistamines such as Benadryl and monitor symptoms closely. If symptoms worsen or if you have severe symptoms including breathing issues, throat closure, significant swelling, whole body hives, severe diarrhea and vomiting, lightheadedness then inject epinephrine and seek immediate medical care afterwards.  Food action plan given.  Get bloodwork for alpha gal.   I don't think onions were causing the rashes.   Environmental allergies  Start environmental control measures as below.  May use over the counter antihistamines such as Zyrtec (cetirizine), Claritin (loratadine), Allegra (fexofenadine), or Xyzal (levocetirizine) daily as needed.  May use Flonase (fluticasone) nasal spray 1 spray per nostril twice a day as needed for nasal congestion.   Follow up in 6-12 months or sooner if needed.   Reducing Pollen Exposure . Pollen seasons: trees (spring), grass (summer) and ragweed/weeds (fall). Marland Kitchen Keep windows closed in your home and car to lower pollen exposure.  Lilian Kapur air conditioning in the bedroom and throughout the house if possible.  . Avoid going out in dry windy days - especially early morning. . Pollen counts are highest between 5 - 10 AM and on dry, hot and windy days.  . Save outside activities for late afternoon or after a heavy rain, when pollen levels are lower.  . Avoid mowing of grass if you have grass pollen allergy. Marland Kitchen Be aware that pollen can also be transported  indoors on people and pets.  . Dry your clothes in an automatic dryer rather than hanging them outside where they might collect pollen.  . Rinse hair and eyes before bedtime. Mold Control . Mold and fungi can grow on a variety of surfaces provided certain temperature and moisture conditions exist.  . Outdoor molds grow on plants, decaying vegetation and soil. The major outdoor mold, Alternaria and Cladosporium, are found in very high numbers during hot and dry conditions. Generally, a late summer - fall peak is seen for common outdoor fungal spores. Rain will temporarily lower outdoor mold spore count, but counts rise rapidly when the rainy period ends. . The most important indoor molds are Aspergillus and Penicillium. Dark, humid and poorly ventilated basements are ideal sites for mold growth. The next most common sites of mold growth are the bathroom and the kitchen. Outdoor (Seasonal) Mold Control . Use air conditioning and keep windows closed. . Avoid exposure to decaying vegetation. Marland Kitchen Avoid leaf raking. . Avoid grain handling. . Consider wearing a face mask if working in moldy areas.  Indoor (Perennial) Mold Control  . Maintain humidity below 50%. . Get rid of mold growth on hard surfaces with water, detergent and, if necessary, 5% bleach (do not mix with other cleaners). Then dry the area completely. If mold covers an area more than 10 square feet, consider hiring an indoor environmental professional. . For clothing, washing with soap and water is best. If moldy items cannot be cleaned and dried, throw them away. . Remove sources e.g. contaminated carpets. . Repair and seal leaking roofs or pipes.  Using dehumidifiers in damp basements may be helpful, but empty the water and clean units regularly to prevent mildew from forming. All rooms, especially basements, bathrooms and kitchens, require ventilation and cleaning to deter mold and mildew growth. Avoid carpeting on concrete or damp floors,  and storing items in damp areas. Pet Allergen Avoidance: . Contrary to popular opinion, there are no "hypoallergenic" breeds of dogs or cats. That is because people are not allergic to an animal's hair, but to an allergen found in the animal's saliva, dander (dead skin flakes) or urine. Pet allergy symptoms typically occur within minutes. For some people, symptoms can build up and become most severe 8 to 12 hours after contact with the animal. People with severe allergies can experience reactions in public places if dander has been transported on the pet owners' clothing. Marland Kitchen Keeping an animal outdoors is only a partial solution, since homes with pets in the yard still have higher concentrations of animal allergens. . Before getting a pet, ask your allergist to determine if you are allergic to animals. If your pet is already considered part of your family, try to minimize contact and keep the pet out of the bedroom and other rooms where you spend a great deal of time. . As with dust mites, vacuum carpets often or replace carpet with a hardwood floor, tile or linoleum. . High-efficiency particulate air (HEPA) cleaners can reduce allergen levels over time. . While dander and saliva are the source of cat and dog allergens, urine is the source of allergens from rabbits, hamsters, mice and [place] pigs; so ask a non-allergic family member to clean the animal's cage. . If you have a pet allergy, talk to your allergist about the potential for allergy immunotherapy (allergy shots). This strategy can often provide long-term relief.

## 2020-08-20 NOTE — Assessment & Plan Note (Addendum)
One episode of nausea, headaches and hive outbreak a 1-2 hours after eating a cheeseburger with red onions. Took Claritin with some benefit but the following day he got lightheaded and passed out. EMS evaluation was unremarkable and was not given any additional medications. Used to eat red meat previously with no issues but noticed breaking out in less severe rashes possibly after eating burgers/onions. History of multiple tick bites in the past.   Today's skin testing showed: Borderline positive to casein, beef and lamb. Negative to onion. Results given.  Handout given on alpha-gal allergy.  Continue strict avoidance of all red meat - beef, pork, lamb, venison.  Monitor symptoms after you eat dairy products and see if makes you break out.   I have prescribed epinephrine injectable and demonstrated proper use. For mild symptoms you can take over the counter antihistamines such as Benadryl and monitor symptoms closely. If symptoms worsen or if you have severe symptoms including breathing issues, throat closure, significant swelling, whole body hives, severe diarrhea and vomiting, lightheadedness then inject epinephrine and seek immediate medical care afterwards.  Food action plan given.  Get bloodwork for alpha gal.   I don't think onions were causing the rashes.

## 2020-08-20 NOTE — Assessment & Plan Note (Signed)
Rhinitis symptoms mainly in the spring and fall and takes Claritin and Flonase as needed daily with good benefit.  Today's skin testing was positive to grass, weed, ragweed, trees, mold, dog, horse. Borderline positive to mouse.   Start environmental control measures as below.  May use over the counter antihistamines such as Zyrtec (cetirizine), Claritin (loratadine), Allegra (fexofenadine), or Xyzal (levocetirizine) daily as needed.  May use Flonase (fluticasone) nasal spray 1 spray per nostril twice a day as needed for nasal congestion.

## 2020-08-20 NOTE — Telephone Encounter (Signed)
Attempted to call patient regarding upcoming cardiac CT appointment. Left message on voicemail with name and callback number  Bonnye Halle Tai RN Navigator Cardiac Imaging [facility] [phone] Office [phone] Cell  

## 2020-08-22 ENCOUNTER — Ambulatory Visit (HOSPITAL_COMMUNITY): Admission: RE | Admit: 2020-08-22 | Payer: No Typology Code available for payment source | Source: Ambulatory Visit

## 2020-10-14 ENCOUNTER — Ambulatory Visit: Payer: No Typology Code available for payment source | Admitting: Family Medicine

## 2021-01-31 ENCOUNTER — Telehealth: Payer: Self-pay | Admitting: Internal Medicine

## 2021-01-31 MED ORDER — FLECAINIDE ACETATE 150 MG PO TABS: 300.0000 mg | ORAL_TABLET | ORAL | 1 refills | Status: DC

## 2021-01-31 NOTE — Telephone Encounter (Signed)
Spoke to the patient and got some clarification on what he needed. He was wanting to set up an appointment to see Dr. Johney Frame stating that he had his first afib episode in a long time last week, he took the last of his flecainide tablets, and feels like its been a while since he saw Dr. Johney Frame. The Flecainide got him back into normal sinus. Flecainide appeared to be discontinued at PCP by mistake. Sent in refill and let patient know he was due for is yearly follow up in August. Requested for follow up in June. Set appointment up and advised the patient if anything changed or he needed to be seen sooner to let us know.   Patient verbalized understanding.

## 2021-01-31 NOTE — Telephone Encounter (Signed)
Patients wife was calling to say what blood work needs to be down in ordered to get the patient procedure done. Patient's wife also state the patient primary dr needs blood work from the patients as well. Wife was seeing if we can do it all at once. Please advise

## 2021-01-31 NOTE — Telephone Encounter (Signed)
*  STAT* If patient is at the pharmacy, call can be transferred to refill team.   1. Which medications need to be refilled? (please list name of each medication and dose if known) loratadine (CLARITIN) 10 MG tablet  2. Which pharmacy/location (including street and city if local pharmacy) is medication to be sent to? [id number] - [place], [place] - [address] AT [address] OF [facility] [facility] [address]  3. Do they need a 30 day or 90 day supply? 90

## 2021-01-31 NOTE — Telephone Encounter (Signed)
Pt's wife calling requesting a refill on flecainide 100 mg tablet. This medication was D/C by another provider. Pt's wife stated that pt still takes this medication and is completely out and need a refill ASAP. Would Dr Johney Frame prescribed this medication again? Please address

## 2021-03-21 ENCOUNTER — Telehealth: Payer: Self-pay | Admitting: *Deleted

## 2021-03-21 NOTE — Telephone Encounter (Signed)
   Name: Gregory Peck  DOB: 1971-08-31  MRN: [id number]   Primary Cardiologist: None  Chart reviewed as part of pre-operative protocol coverage. Patient was contacted 03/21/2021 in reference to pre-operative risk assessment for pending surgery as outlined below.  Gregory Peck was last seen on 06/18/20 by Dr. Johney Frame. He has a history of atrial fibrillation s/p ablation on Flecainide.  Left VM requesting call back.    Alver Sorrow, NP 03/21/2021, 10:23 AM

## 2021-03-21 NOTE — Telephone Encounter (Signed)
   Name: Gregory Peck  DOB: 02/13/1971  MRN: [id number]   Primary Cardiologist: None  Chart reviewed as part of pre-operative protocol coverage. Patient was contacted 03/21/2021 in reference to pre-operative risk assessment for pending surgery as outlined below.  Gregory Peck was last seen on 06/18/20 by Dr. Johney Frame.  Since that day, Gregory Peck has done well. He notes one episode of atrial fibrillation for which he resumed flecainide and symptoms have not recurred. He reports no anginal symptoms and exercise tolerance of >4 METS.  Therefore, based on ACC/AHA guidelines, the patient would be at acceptable risk for the planned procedure without further cardiovascular testing.   The patient was advised that if he develops new symptoms prior to surgery to contact our office to arrange for a follow-up visit, and he verbalized understanding.  I will route this recommendation to the requesting party via Epic fax function and remove from pre-op pool. Please call with questions.  Alver Sorrow, NP 03/21/2021, 12:32 PM

## 2021-03-21 NOTE — Telephone Encounter (Signed)
   [facility] Pre-operative Risk Assessment    Patient Name: Cheng Dec  DOB: November 20, 1970  MRN: [id number]   HEARTCARE STAFF: - Please ensure there is not already an duplicate clearance open for this procedure. - Under Visit Info/Reason for Call, type in Other and utilize the format Clearance MM/DD/YY or Clearance TBD. Do not use dashes or single digits. - If request is for dental extraction, please clarify the # of teeth to be extracted.  Request for surgical clearance: URGENT PER DR. Gwyndolyn Saxon GRAMIG  1. What type of surgery is being performed? RIGHT DISTAL BICEP REPAIR; RECONSTRUCTION IS NECESSARY   2. When is this surgery scheduled? 03/28/21 URGENT   3. What type of clearance is required (medical clearance vs. Pharmacy clearance to hold med vs. Both)? MEDICAL  4. Are there any medications that need to be held prior to surgery and how long? NONE LISTED   5. Practice name and name of physician performing surgery? [facility]; DR. Gwyndolyn Saxon GRAMIG   6. What is the office phone number? [phone]   7.   What is the office fax number? [place]  8.   Anesthesia type (None, local, MAC, general) ? GENERAL   Julaine Hua 03/21/2021, 9:24 AM  _________________________________________________________________   (provider comments below)

## 2021-04-16 ENCOUNTER — Ambulatory Visit: Payer: No Typology Code available for payment source | Admitting: Internal Medicine

## 2021-04-16 DIAGNOSIS — I4892 Unspecified atrial flutter: Secondary | ICD-10-CM

## 2021-04-16 DIAGNOSIS — I480 Paroxysmal atrial fibrillation: Secondary | ICD-10-CM

## 2021-08-22 LAB — HM COLONOSCOPY

## 2021-09-16 ENCOUNTER — Encounter: Payer: Self-pay | Admitting: Internal Medicine

## 2022-06-09 ENCOUNTER — Telehealth: Payer: Self-pay | Admitting: Internal Medicine

## 2022-06-09 DIAGNOSIS — I252 Old myocardial infarction: Secondary | ICD-10-CM

## 2022-06-09 NOTE — Telephone Encounter (Signed)
New message   Pt would like calcium score CT ordered

## 2022-06-09 NOTE — Telephone Encounter (Signed)
Attempted phone call to pt and left voicemail message for pt to contact RN at [phone].

## 2022-06-15 NOTE — Telephone Encounter (Signed)
Pt spouse returning a call to have an order placed for calcium score

## 2022-06-18 NOTE — Telephone Encounter (Signed)
Spoke with pt and advised Dr Graciela Husbands is of of the office until next week.  Pt has not been seen in the office in over 2 years. He is requesting an order for a Calcium Score CT. Pt denies current CP or SOB.   Pt advised will have to discuss with Dr Graciela Husbands further before placing order and that he may want to further evaluate pt prior to ordering this test.  Pt is scheduled to see Dr Graciela Husbands 08/07/2022.  Pt verbalizes understanding and thanked Charity fundraiser for the phone call.

## 2022-06-23 NOTE — Telephone Encounter (Signed)
No problem for CaScore

## 2022-06-25 NOTE — Addendum Note (Signed)
Addended by: Alois Cliche on: 06/25/2022 07:40 AM   Modules accepted: Orders

## 2022-06-25 NOTE — Telephone Encounter (Signed)
Order placed for Calcium Scoring CT

## 2022-07-30 ENCOUNTER — Ambulatory Visit (HOSPITAL_BASED_OUTPATIENT_CLINIC_OR_DEPARTMENT_OTHER)
Admission: RE | Admit: 2022-07-30 | Discharge: 2022-07-30 | Disposition: A | Discharge status: Home / Self Care | Payer: No Typology Code available for payment source | Source: Ambulatory Visit | Attending: Internal Medicine | Admitting: Internal Medicine

## 2022-07-30 DIAGNOSIS — I252 Old myocardial infarction: Secondary | ICD-10-CM | POA: Insufficient documentation

## 2022-08-07 ENCOUNTER — Ambulatory Visit: Payer: No Typology Code available for payment source | Admitting: Internal Medicine

## 2022-08-07 DIAGNOSIS — I4891 Unspecified atrial fibrillation: Secondary | ICD-10-CM

## 2023-11-08 ENCOUNTER — Telehealth: Payer: Self-pay | Admitting: Internal Medicine

## 2023-11-08 NOTE — Telephone Encounter (Signed)
Left message for patient's wife Andrey Campanile (OK per [facility]) informing her we are not able to assist much without patient being seen in office. Suggest ED for evaluation and possible cardioversion or reaching out to PCP until patient can be see in our office on 1/16. Provided office number for callback if any questions.

## 2023-11-08 NOTE — Telephone Encounter (Signed)
Patient c/o Palpitations:  High priority if patient c/o lightheadedness, shortness of breath, or chest pain  How long have you had palpitations/irregular HR/ Afib? Are you having the symptoms now? Since yesterday   Are you currently experiencing lightheadedness, SOB or CP? A-Fib isn't strong like it used to be, but wife Andrey Campanile states he is out of rhythm. Andrey Campanile states he is asleep, so doesn't think he is. She states he is trying to rest   Do you have a history of afib (atrial fibrillation) or irregular heart rhythm? Yes   Have you checked your BP or HR? (document readings if available): Hasn't checked readings  Are you experiencing any other symptoms? No    Sandy, patients wife states patient was previously seen by [facility]. Is back in A-Fib, so would now like to re-establish care. She states that the medication usually works but they're out of it. Advised since it's been so long, patient would have to be seen first before the medication can be filled. Went ahead and scheduled patient for soonest - 1/16 with [facility] + put on the wait list as a high priority. Sandy asked what are they to do in the meantime - advised I will send a message regarding the A-Fib so a nurse can call back, and put him on the wait list as a high priority. Andrey Campanile states that the ER is always an option but doesn't want to go that route. Since patient is resting, she would like a callback to her at [phone].

## 2023-11-25 ENCOUNTER — Ambulatory Visit
Payer: No Typology Code available for payment source | Attending: Cardiovascular Disease | Admitting: Cardiovascular Disease

## 2023-11-25 ENCOUNTER — Encounter: Payer: Self-pay | Admitting: Cardiovascular Disease

## 2023-11-25 VITALS — BP 132/80 | HR 62 | Ht 79.0 in | Wt 277.4 lb

## 2023-11-25 DIAGNOSIS — I480 Paroxysmal atrial fibrillation: Secondary | ICD-10-CM | POA: Diagnosis not present

## 2023-11-25 DIAGNOSIS — R55 Syncope and collapse: Secondary | ICD-10-CM

## 2023-11-25 DIAGNOSIS — I4719 Other supraventricular tachycardia: Secondary | ICD-10-CM

## 2023-11-25 MED ORDER — METOPROLOL TARTRATE 25 MG PO TABS: ORAL_TABLET | ORAL | 1 refills | Status: AC

## 2023-11-25 MED ORDER — FLECAINIDE ACETATE 150 MG PO TABS: 300.0000 mg | ORAL_TABLET | ORAL | 0 refills | Status: AC

## 2023-11-25 NOTE — Progress Notes (Signed)
  Electrophysiology Office Note:    Date:  11/25/2023   ID:  Gregory Peck, DOB Jul 24, 1971, MRN [id number]  PCP:  Madelin Headings, MD   [facility] Providers Cardiologist:  None     Referring MD: Madelin Headings, MD   History of Present Illness:    Gregory Peck is a 53 y.o. male with a medical history significant for atrial fibrillation status post ablation who presents to reestablish care for atrial fibrillation.     He is symptomatic paroxysmal atrial fibrillation and atrial flutter.  His arrhythmia was managed for several years with pill in the pocket flecainide.  Distal worked well for him for the most part.  In addition to the fibrillation, he had different tachycardia episodes that were "more aggressive" and less well-tolerated.  These he refers to as SVT episodes.  There is one EKG from October 01, 2015 that shows a rapid rhythm at 196 bpm.  He has had syncope that Dr. Graciela Husbands attributed to rapid conduction of his arrhythmia.  He underwent pulmonary vein isolation and CTI ablation by Dr. Johney Frame in February 2017.  Since, he has remained essentially arrhythmia free.  He has not had a single episode of the "aggressive SVT" regular, rapid palpitations.  During this past Christmas time, he noted recurrence of palpitations.  He describes these as fairly regular with an occasional "hiccup."  His rate during these episodes is normal.  The episodes only last a couple seconds.  He has not had any rapid rates or presyncope.       Today, he reports that he is doing well.  He is at baseline, no complaints today.  EKGs/Labs/Other Studies Reviewed Today:     Echocardiogram:  TTE May 2016 Normal EF.  Mildly dilated left and right atria.    Advanced imaging:  Coronary artery calcium score September 2023 Coronary calcium score 0  Cardiac catherization  Left heart cath November 2016 Normal coronary arteries  EKG:         Physical Exam:    VS:  BP 132/80 (BP  Location: Left Arm, Patient Position: Sitting, Cuff Size: Large)   Pulse 62   Ht 6\' 7"  (2.007 m)   Wt 277 lb 6.4 oz (125.8 kg)   SpO2 95%   BMI 31.25 kg/m     Wt Readings from Last 3 Encounters:  11/25/23 277 lb 6.4 oz (125.8 kg)  08/20/20 253 lb (114.8 kg)  08/01/20 252 lb (114.3 kg)     GEN: Well nourished, well developed in no acute distress CARDIAC: RRR, no murmurs, rubs, gallops RESPIRATORY:  Normal work of breathing MUSCULOSKELETAL: no edema    ASSESSMENT & PLAN:     Paroxysmal atrial fibrillation S/p ablation with PVI by RF in 12/2015 Recent palpitations (described as regular rate and rhythm with intermittent "hiccups") sound more consistent with intermittent PACs Pt is going to obtain a home monitoring device -- apple watch or AliveCor/KardiaMobile Will try metoprolol 25mg  PO PRN since episodes are often brought on by stress and/or stimulants Will refill pill-in-the-pocket flecainide (300mg  PO PRN) to have available if unresponsive to metoprolol   Secondary hypercoagulable state CHADSS2VASc score is 0  Typical atrial flutter Prior ECGs suggestive of typical atrial flutter Supported by resolution of the arrhythmia with CTI ablation History of syncope, possibly rapid conduction of atrial arrhythmia      Signed, Maurice Small, MD  11/25/2023 4:40 PM    [facility]

## 2023-11-25 NOTE — Patient Instructions (Addendum)
Medication Instructions:  START Metoprolol Tartrate 25 mg twice daily as needed for palpitations Refill sent in for Flecainide  *If you need a refill on your cardiac medications before your next appointment, please call your pharmacy*   Follow-Up: At [facility], you and your health needs are our priority.  As part of our continuing mission to provide you with exceptional heart care, we have created designated Provider Care Teams.  These Care Teams include your primary Cardiologist (physician) and Advanced Practice Providers (APPs -  Physician Assistants and Nurse Practitioners) who all work together to provide you with the care you need, when you need it.  We recommend signing up for the patient portal called "MyChart".  Sign up information is provided on this After Visit Summary.  MyChart is used to connect with patients for Virtual Visits (Telemedicine).  Patients are able to view lab/test results, encounter notes, upcoming appointments, etc.  Non-urgent messages can be sent to your provider as well.   To learn more about what you can do with MyChart, go to [email or web address].    Your next appointment:   6 month(s)  Provider:   York Pellant, MD    OTHER: Dr Nelly Laurence recommends to get a [facility] [email or web address]
